# Patient Record
Sex: Female | Born: 1985 | Race: Black or African American | Hispanic: No | Marital: Single | Smoking: Former smoker
Health system: Southern US, Community
[De-identification: ages and names within clinical notes are randomized; demographics above are authoritative.]

## PROBLEM LIST (undated history)

## (undated) DIAGNOSIS — E119 Type 2 diabetes mellitus without complications: Secondary | ICD-10-CM

## (undated) DIAGNOSIS — T8859XA Other complications of anesthesia, initial encounter: Secondary | ICD-10-CM

## (undated) DIAGNOSIS — D649 Anemia, unspecified: Secondary | ICD-10-CM

## (undated) DIAGNOSIS — F4310 Post-traumatic stress disorder, unspecified: Secondary | ICD-10-CM

## (undated) DIAGNOSIS — K219 Gastro-esophageal reflux disease without esophagitis: Secondary | ICD-10-CM

## (undated) DIAGNOSIS — E282 Polycystic ovarian syndrome: Secondary | ICD-10-CM

## (undated) DIAGNOSIS — G4730 Sleep apnea, unspecified: Secondary | ICD-10-CM

## (undated) DIAGNOSIS — F32A Depression, unspecified: Secondary | ICD-10-CM

## (undated) DIAGNOSIS — G43909 Migraine, unspecified, not intractable, without status migrainosus: Secondary | ICD-10-CM

## (undated) DIAGNOSIS — F419 Anxiety disorder, unspecified: Secondary | ICD-10-CM

## (undated) DIAGNOSIS — C539 Malignant neoplasm of cervix uteri, unspecified: Secondary | ICD-10-CM

## (undated) HISTORY — PX: DILATION AND CURETTAGE, DIAGNOSTIC / THERAPEUTIC: SUR384

## (undated) HISTORY — PX: WISDOM TOOTH EXTRACTION: SHX21

## (undated) HISTORY — PX: CERVICAL POLYPECTOMY: SHX88

---

## 2006-08-07 ENCOUNTER — Emergency Department: Payer: Self-pay | Admitting: Emergency Medicine

## 2009-11-15 ENCOUNTER — Emergency Department: Payer: Self-pay | Admitting: Unknown Physician Specialty

## 2011-02-01 ENCOUNTER — Emergency Department: Payer: Self-pay

## 2011-12-22 ENCOUNTER — Emergency Department: Payer: Self-pay | Admitting: Emergency Medicine

## 2011-12-22 LAB — URINALYSIS, COMPLETE
Bilirubin,UR: NEGATIVE
Ketone: NEGATIVE
Nitrite: NEGATIVE
Ph: 6 (ref 4.5–8.0)
Protein: NEGATIVE
Specific Gravity: 1.005 (ref 1.003–1.030)
WBC UR: 7 /HPF (ref 0–5)

## 2011-12-22 LAB — HCG, QUANTITATIVE, PREGNANCY: Beta Hcg, Quant.: 149626 m[IU]/mL — ABNORMAL HIGH

## 2011-12-22 LAB — PREGNANCY, URINE: Pregnancy Test, Urine: POSITIVE m[IU]/mL

## 2012-01-20 ENCOUNTER — Emergency Department: Payer: Self-pay | Admitting: Emergency Medicine

## 2012-01-20 LAB — URINALYSIS, COMPLETE
Bilirubin,UR: NEGATIVE
Glucose,UR: NEGATIVE mg/dL (ref 0–75)
Ketone: NEGATIVE
Leukocyte Esterase: NEGATIVE
Nitrite: NEGATIVE
Ph: 6 (ref 4.5–8.0)
Protein: NEGATIVE
RBC,UR: 13 /HPF (ref 0–5)
Squamous Epithelial: 1
WBC UR: 5 /HPF (ref 0–5)

## 2012-01-20 LAB — HCG, QUANTITATIVE, PREGNANCY: Beta Hcg, Quant.: 89989 m[IU]/mL — ABNORMAL HIGH

## 2012-01-20 LAB — CBC
HCT: 34.4 % — ABNORMAL LOW (ref 35.0–47.0)
HGB: 11 g/dL — ABNORMAL LOW (ref 12.0–16.0)
MCH: 28.9 pg (ref 26.0–34.0)
MCV: 90 fL (ref 80–100)
Platelet: 178 10*3/uL (ref 150–440)
RBC: 3.82 10*6/uL (ref 3.80–5.20)
RDW: 13.5 % (ref 11.5–14.5)

## 2012-01-27 ENCOUNTER — Ambulatory Visit: Payer: Self-pay | Admitting: Obstetrics & Gynecology

## 2012-02-11 ENCOUNTER — Ambulatory Visit: Payer: Self-pay | Admitting: Obstetrics & Gynecology

## 2012-03-13 ENCOUNTER — Ambulatory Visit: Payer: Self-pay | Admitting: Obstetrics & Gynecology

## 2012-06-17 ENCOUNTER — Observation Stay: Payer: Self-pay | Admitting: Obstetrics and Gynecology

## 2012-07-15 ENCOUNTER — Observation Stay: Payer: Self-pay | Admitting: Obstetrics and Gynecology

## 2012-07-15 LAB — PIH PROFILE
Anion Gap: 10 (ref 7–16)
BUN: 9 mg/dL (ref 7–18)
Calcium, Total: 9.6 mg/dL (ref 8.5–10.1)
Creatinine: 0.91 mg/dL (ref 0.60–1.30)
EGFR (African American): >60
Glucose: 122 mg/dL — ABNORMAL HIGH (ref 65–99)
HCT: 33 % — ABNORMAL LOW (ref 35.0–47.0)
HGB: 11.2 g/dL — ABNORMAL LOW (ref 12.0–16.0)
MCH: 30 pg (ref 26.0–34.0)
MCHC: 34 g/dL (ref 32.0–36.0)
MCV: 88 fL (ref 80–100)
Osmolality: 276 (ref 275–301)
RBC: 3.74 10*6/uL — ABNORMAL LOW (ref 3.80–5.20)
WBC: 8.9 10*3/uL (ref 3.6–11.0)

## 2012-07-15 LAB — PROTEIN / CREATININE RATIO, URINE: Protein, Random Urine: 50 mg/dL — ABNORMAL HIGH (ref 0–12)

## 2012-07-19 ENCOUNTER — Inpatient Hospital Stay: Payer: Self-pay | Admitting: Obstetrics & Gynecology

## 2012-07-19 LAB — CBC WITH DIFFERENTIAL/PLATELET
Basophil #: 0 10*3/uL (ref 0.0–0.1)
Eosinophil #: 0.1 10*3/uL (ref 0.0–0.7)
Eosinophil #: 0.1 10*3/uL (ref 0.0–0.7)
Eosinophil %: 1.3 %
Eosinophil %: 0.5 %
HCT: 32.3 % — ABNORMAL LOW (ref 35.0–47.0)
HGB: 11.1 g/dL — ABNORMAL LOW (ref 12.0–16.0)
HGB: 11.6 g/dL — ABNORMAL LOW (ref 12.0–16.0)
Lymphocyte #: 1.6 10*3/uL (ref 1.0–3.6)
Lymphocyte %: 17.2 %
Lymphocyte %: 11.7 %
MCHC: 34.4 g/dL (ref 32.0–36.0)
MCV: 88 fL (ref 80–100)
MCV: 89 fL (ref 80–100)
Monocyte #: 0.8 x10 3/mm (ref 0.2–0.9)
Monocyte %: 7.8 %
Neutrophil #: 9 10*3/uL — ABNORMAL HIGH (ref 1.4–6.5)
Neutrophil %: 73.1 %
Neutrophil %: 80 %
Platelet: 103 10*3/uL — ABNORMAL LOW (ref 150–440)
Platelet: 105 10*3/uL — ABNORMAL LOW (ref 150–440)
RDW: 16.4 % — ABNORMAL HIGH (ref 11.5–14.5)
WBC: 9.3 10*3/uL (ref 3.6–11.0)

## 2012-07-19 LAB — RUPTURE OF MEMBRANE PLUS: Rom Plus: DETECTED

## 2012-10-13 ENCOUNTER — Ambulatory Visit: Payer: Self-pay | Admitting: Obstetrics & Gynecology

## 2012-10-13 LAB — CBC
HCT: 39.8 % (ref 35.0–47.0)
MCHC: 33.6 g/dL (ref 32.0–36.0)
MCV: 91 fL (ref 80–100)
Platelet: 177 10*3/uL (ref 150–440)
RBC: 4.39 10*6/uL (ref 3.80–5.20)
RDW: 15.4 % — ABNORMAL HIGH (ref 11.5–14.5)

## 2012-10-13 LAB — PREGNANCY, URINE: Pregnancy Test, Urine: NEGATIVE m[IU]/mL

## 2012-10-14 ENCOUNTER — Ambulatory Visit: Payer: Self-pay | Admitting: Obstetrics & Gynecology

## 2013-02-14 ENCOUNTER — Emergency Department: Payer: Self-pay | Admitting: Emergency Medicine

## 2013-02-17 LAB — BETA STREP CULTURE(ARMC)

## 2013-05-07 ENCOUNTER — Emergency Department: Payer: Self-pay | Admitting: Emergency Medicine

## 2013-05-07 LAB — COMPREHENSIVE METABOLIC PANEL
Albumin: 3.5 g/dL (ref 3.4–5.0)
Alkaline Phosphatase: 68 U/L
Anion Gap: 5 — ABNORMAL LOW (ref 7–16)
BUN: 8 mg/dL (ref 7–18)
Bilirubin,Total: 0.4 mg/dL (ref 0.2–1.0)
Calcium, Total: 8.5 mg/dL (ref 8.5–10.1)
Chloride: 105 mmol/L (ref 98–107)
Co2: 26 mmol/L (ref 21–32)
Creatinine: 0.85 mg/dL (ref 0.60–1.30)
EGFR (African American): >60
EGFR (Non-African Amer.): >60
Glucose: 82 mg/dL (ref 65–99)
Osmolality: 269 (ref 275–301)
Potassium: 3.6 mmol/L (ref 3.5–5.1)
SGOT(AST): 16 U/L (ref 15–37)
SGPT (ALT): 12 U/L (ref 12–78)
Sodium: 136 mmol/L (ref 136–145)
Total Protein: 8.4 g/dL — ABNORMAL HIGH (ref 6.4–8.2)

## 2013-05-07 LAB — CBC
HCT: 39.3 % (ref 35.0–47.0)
HGB: 12.8 g/dL (ref 12.0–16.0)
MCH: 29.3 pg (ref 26.0–34.0)
MCHC: 32.7 g/dL (ref 32.0–36.0)
MCV: 90 fL (ref 80–100)
PLATELETS: 198 10*3/uL (ref 150–440)
RBC: 4.37 10*6/uL (ref 3.80–5.20)
RDW: 13.9 % (ref 11.5–14.5)
WBC: 9.2 10*3/uL (ref 3.6–11.0)

## 2013-05-07 LAB — URINALYSIS, COMPLETE
BACTERIA: NONE SEEN
BLOOD: NEGATIVE
Bilirubin,UR: NEGATIVE
Glucose,UR: NEGATIVE mg/dL (ref 0–75)
Hyaline Cast: 2
Ketone: NEGATIVE
Leukocyte Esterase: NEGATIVE
Nitrite: NEGATIVE
Ph: 5 (ref 4.5–8.0)
Specific Gravity: 1.024 (ref 1.003–1.030)
Squamous Epithelial: 2
WBC UR: 7 /HPF (ref 0–5)

## 2013-05-07 LAB — MONONUCLEOSIS SCREEN: MONO TEST: POSITIVE

## 2013-05-07 LAB — RAPID INFLUENZA A&B ANTIGENS

## 2013-05-10 LAB — BETA STREP CULTURE(ARMC)

## 2013-09-03 IMAGING — US US OB < 14 WEEKS
1 series · 14 of 28 positions shown · non-contrast
Comparison: none

REASON FOR EXAM: 13 weeks with painles vaginal bleeding
COMMENTS:

[Series 1: us ob < 14 weeks · 0.23mm/px · 44 acquisitions, 14 frames shown]
[im 2/44]
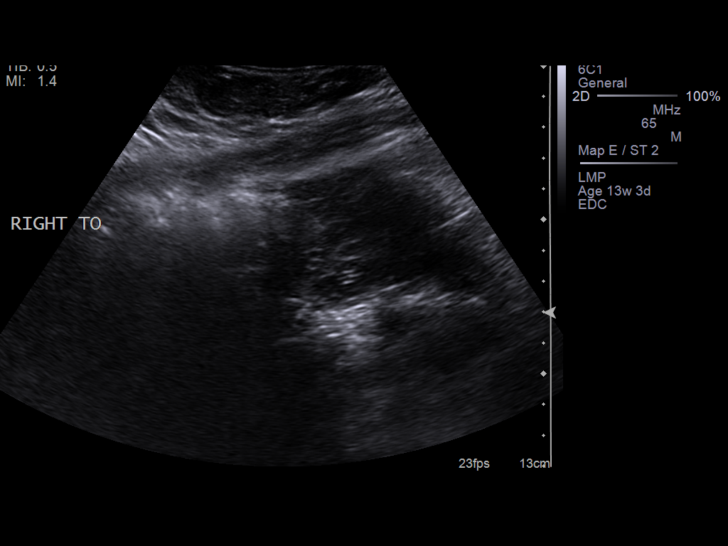
[im 5/44]
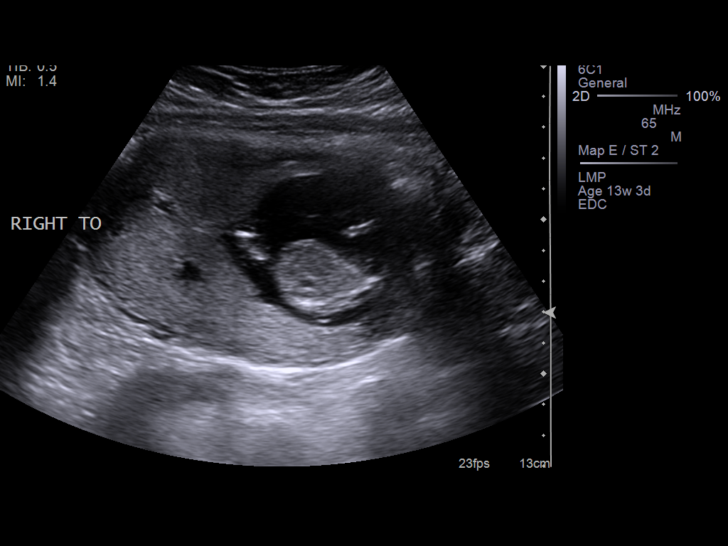
[im 8/44]
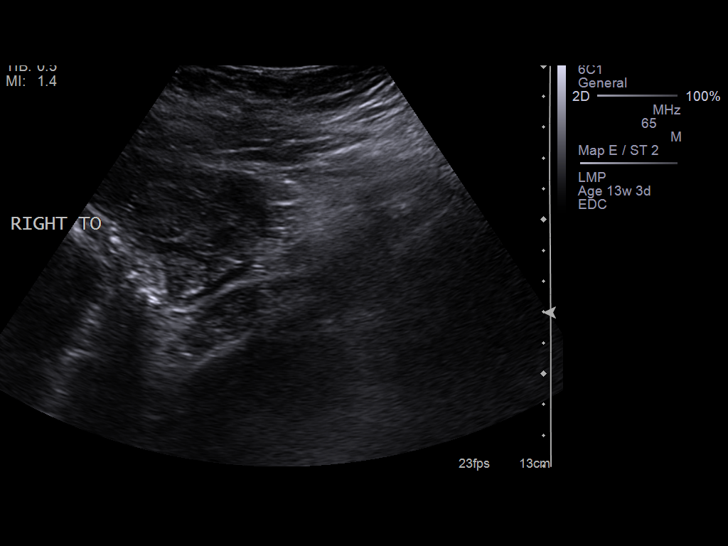
[im 12/44]
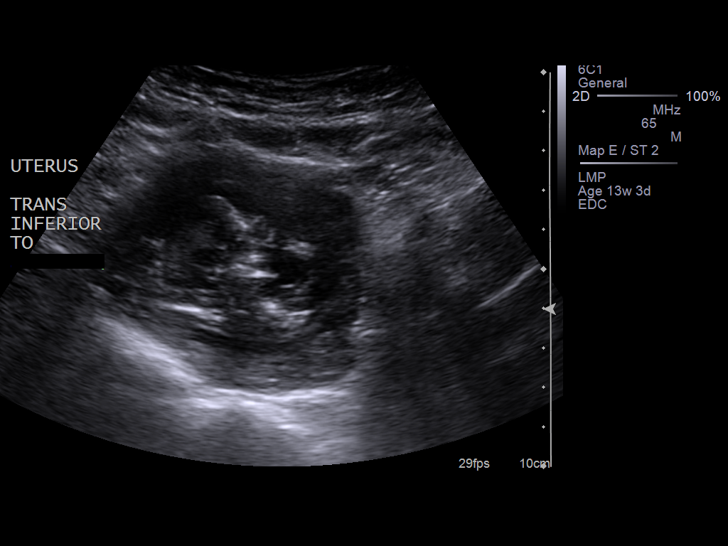
[im 15/44]
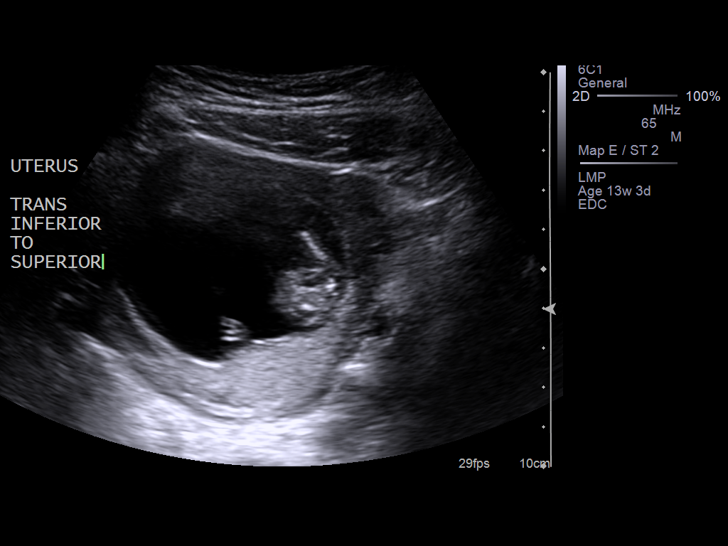
[im 18/44]
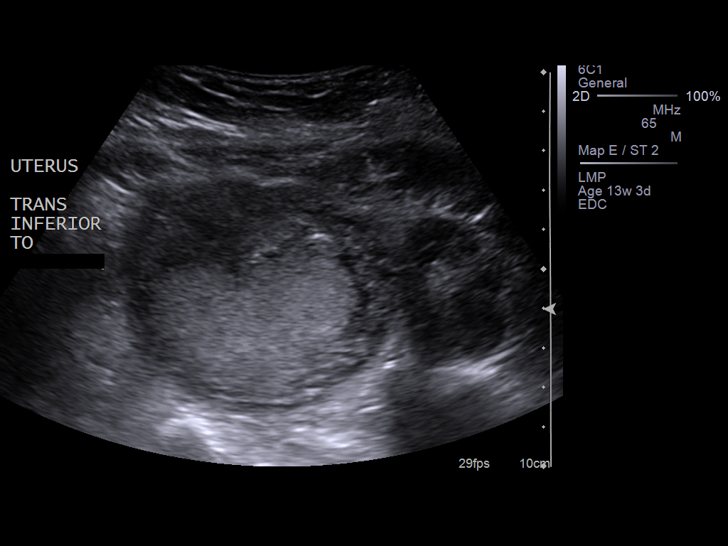
[im 21/44]
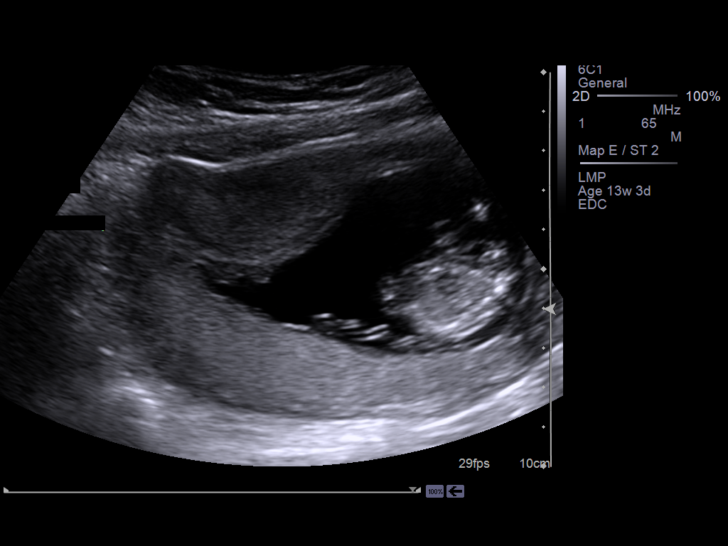
[im 24/44]
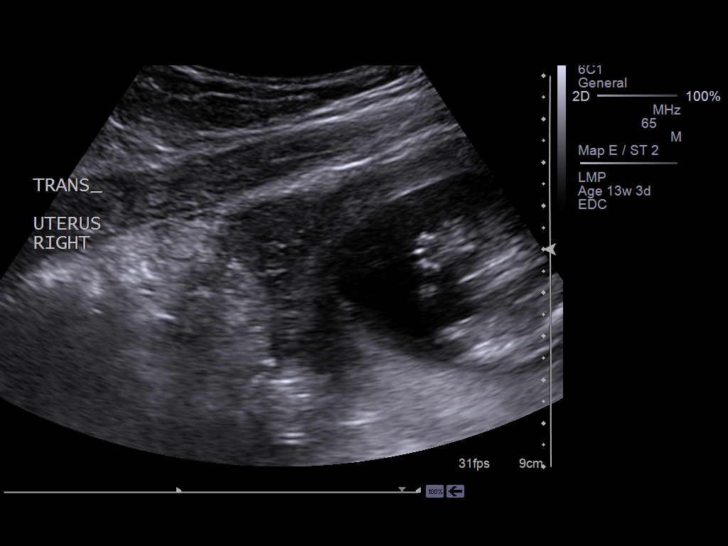
[im 28/44]
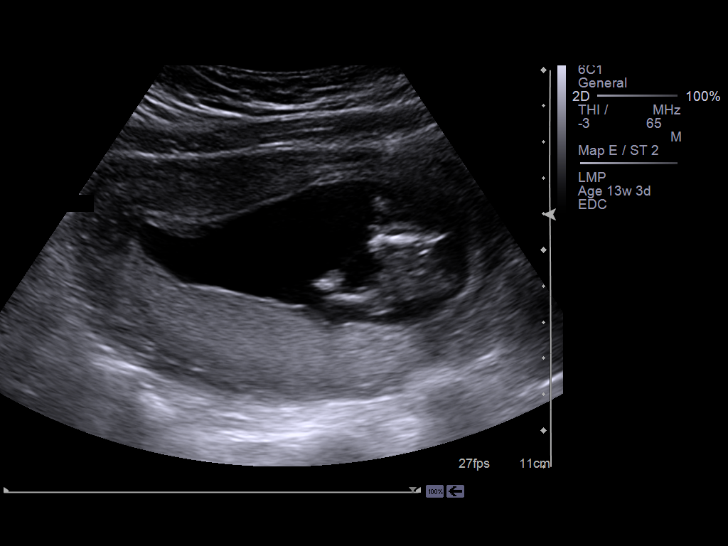
[im 31/44]
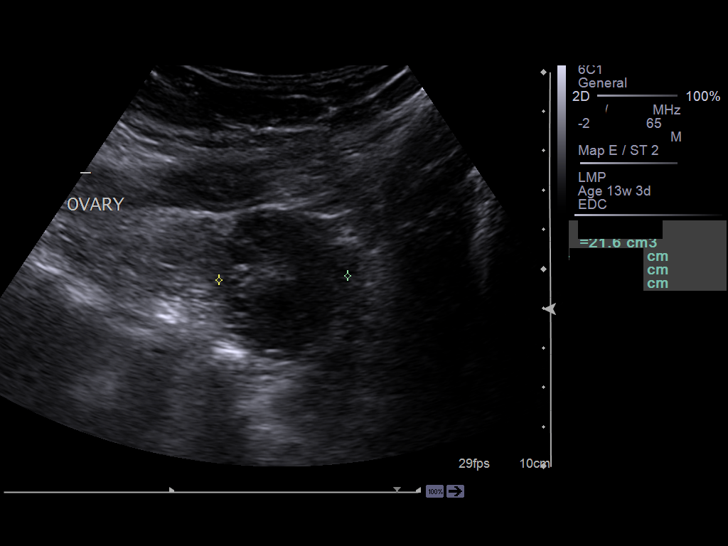
[im 34/44]
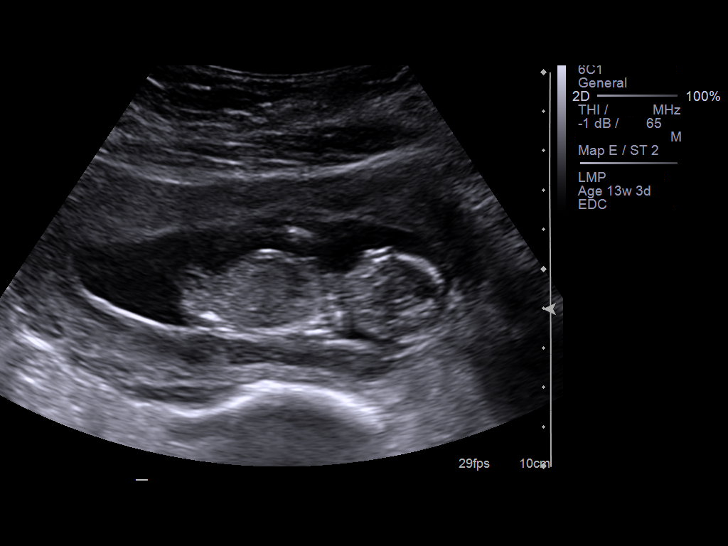
[im 37/44]
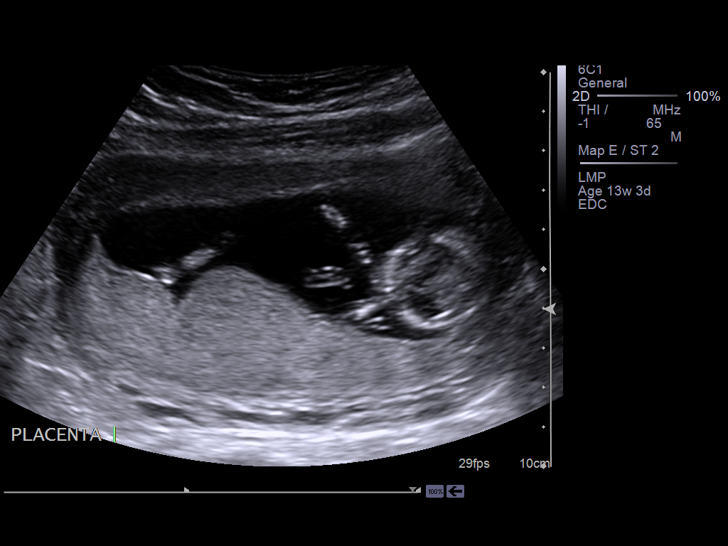
[im 40/44]
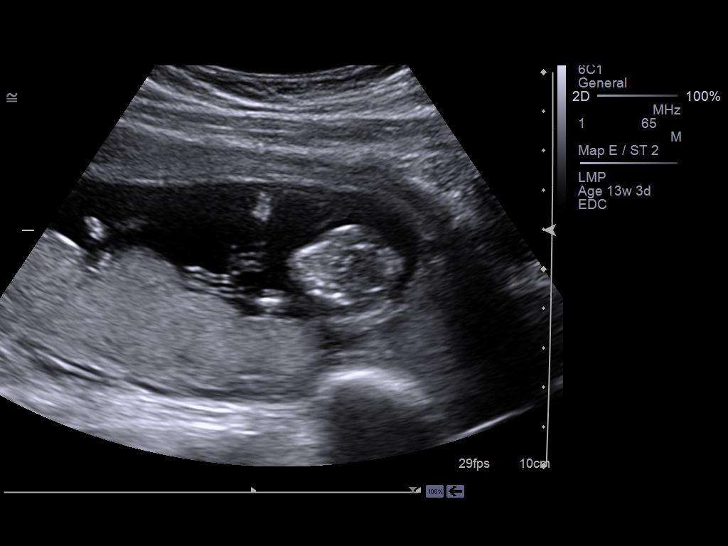
[im 44/44]
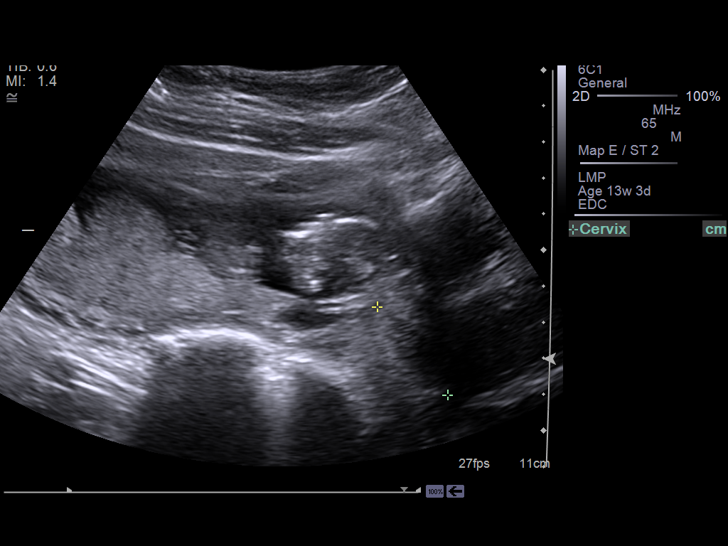

[14 of 28 positions shown; findings below may reference images not displayed]

PROCEDURE:     US  - US OB LESS THAN 14 WEEKS  - January 20, 2012  [DATE]

RESULT:     Single viable intrauterine pregnancy is present with a fetal
heart rate of 157 beats per minute. Fetal age of 13 weeks 2 days by
measurements. Ovaries are normal. Age-appropriate growth  from prior study
of 12/22/2011. 2 cm fibroid is noted in the right ureter wall. Amniotic
fluid volume is normal. Cervix normal.
IMPRESSION: Single viable intrauterine pregnancy. Small uterine fibroid.

## 2013-09-30 ENCOUNTER — Emergency Department: Payer: Self-pay | Admitting: Emergency Medicine

## 2013-09-30 LAB — URINALYSIS, COMPLETE
Bilirubin,UR: NEGATIVE
Blood: NEGATIVE
Ketone: NEGATIVE
Leukocyte Esterase: NEGATIVE
Nitrite: POSITIVE
Ph: 6 (ref 4.5–8.0)
Protein: NEGATIVE
SPECIFIC GRAVITY: 1.021 (ref 1.003–1.030)
Squamous Epithelial: 2
WBC UR: 3 /HPF (ref 0–5)

## 2014-06-02 NOTE — Op Note (Signed)
PATIENT NAME:  Michelle Holland, Michelle Holland MR#:  [id number] DATE OF BIRTH:  [birth year]  DATE OF PROCEDURE:  07/20/2012  PREOPERATIVE DIAGNOSIS:   Large for gestational age baby, small pelvis, arrest of dilation at 4 cm.  Gestational diabetes on medication, poorly controlled.   POSTOPERATIVE DIAGNOSIS:  Large for gestational age baby, small pelvis, arrest of dilation at 4 cm.  Gestational diabetes on medication, poorly controlled.   PROCEDURE:  Primary LUT C-section.   ESTIMATED BLOOD LOSS:  1000 mL.   FINDINGS:  A 9 pound, 5 ounce baby with normal uterus, tubes and ovaries bilaterally,  large theca lutein cysts on the ovaries bilaterally.    SURGEON:  Delsa Sale, M.D.   ASSISTANTDonzetta Matters.   DESCRIPTION OF PROCEDURE:  The patient was taken to the operating room and placed in supine position. After adequate anesthesia was instilled, the patient was prepped and draped in the usual sterile fashion. Pfannenstiel skin incision was drawn and cut 2 fingerbreadths above the pubic symphysis and carried sharply down to the fascia. The fascia was nicked in the midline, and the incision was extended in a superolateral manner. The muscle bellies were sharply and bluntly dissected off the rectus fascia. Midline of the bellies was identified and split. The peritoneum was grasped, cut and entered. Bladder blade was placed. A bladder flap was created. Uterine incision was made, and infant was delivered and bulb suctioned. Remainder of the infant's body was delivered with some difficulty due to the size of the baby. The uterus was exteriorized and wrapped in a moist laparotomy sponge. The interior of the uterus was curetted with a moist lap sponge. Penningtons were placed on the uterine incision edge and chromic in a locked fashion was used to close the incision. A second imbricating incision was made over that.  The bladder flap was then tacked back up to the uterine incision using 3-0 chromic. The belly of  the abdomen was cleared of clots. The uterus was placed back into the abdomen. Kelly clamps were used to grasp the peritoneum. The muscle bellies were approximated and sewn in  a running fashion with Vicryl suture. The muscle bellies were found to be seen as static. The ON-Q trocars were placed. The fascia was closed in a running fashion with PDS suture from both corners meeting in the midline. The midline XLH was then used to approximate the subcutaneous fat. Skin clips were placed. A bandage was placed. Dermabond was placed where the catheters from the ON-Q came from the belly. The catheters were wrapped around a 4 x 4. Steri-Strips were placed. Tegaderm was placed. The uterus was found to be firm and clots were expressed. Clear urine was noted in the Foley bag, and the patient was taken to recovery after having tolerated the procedure well.     ____________________________ Delsa Sale, MD cck:dmm D: 07/22/2012 08:17:00 ET T: 07/22/2012 10:26:55 ET JOB#: [id number]  cc: Delsa Sale, MD, <Dictator> Delsa Sale MD ELECTRONICALLY SIGNED 07/26/2012 22:04

## 2014-06-02 NOTE — Op Note (Signed)
PATIENT NAME:  Michelle Holland, Michelle Holland MR#:  [id number] DATE OF BIRTH:  [birth year]  DATE OF PROCEDURE:  10/14/2012  PREOPERATIVE DIAGNOSIS: Abnormal uterine bleeding with endometrial abnormality.  POSTOPERATIVE DIAGNOSIS: Abnormal uterine bleeding with endometrial abnormality.  PROCEDURES: Hysteroscopy dilation and curettage.  SURGEON: Glean Salen, MD  ANESTHESIA: General.   ESTIMATED BLOOD LOSS: Minimal.   COMPLICATIONS: None.   FINDINGS: Proliferative endometrium with no polyps or fibroids seen.  DISPOSITION: To recovery room in stable condition.   TECHNIQUE: The patient is prepped and draped in the usual sterile fashion after adequate anesthesia is obtained in the dorsal lithotomy position. The bladder is drained with a catheter followed by placement of the speculum and the cervix is grasped with a tenaculum. The cervix is dilated to a size 20 Pratt dilator and then a 30 degree hysteroscope with lactated Ringer distention of intrauterine cavity is performed. The above-mentioned findings are visualized. The ostia are seen bilaterally and no gross abnormalities. Hysteroscope is removed with minimal discrepancy in fluid and curettage is performed using a banjo curette with specimen sent to pathology for further review. The tenaculum is removed and a suture is placed over the tenaculum sites to obtain hemostasis. There is minimal bleeding coming from the cervix. The patient is cleansed of all blood and Betadine and goes to the recovery room in stable condition. All sponge, instrument and needle counts are correct.  ____________________________ R. Barnett Applebaum, MD rph:sb D: 10/14/2012 12:52:57 ET T: 10/14/2012 13:14:03 ET JOB#: [id number]  cc: Glean Salen, MD, <Dictator> Gae Dry MD ELECTRONICALLY SIGNED 10/14/2012 18:15

## 2014-06-20 NOTE — H&P (Signed)
L&D Evaluation:  History:  HPI 29 yo G1 at 38w5dgestational age by LMP consistent with 1st trimester ultrasound.  Pregnancy complicated by obesity and gestational diabetes that has been diet controlled.  She was seen in clinic today and noted to have new-onset edema, an 8-pound weight gain over the past 2 weeks, some intermittent headaches, and proteinuria (trace).  She was sent to L&D for further assessment.  Here she notes positive fetal movement, no vaginal bleeding, no leakage of fluid (though she thinks she lost her mucous plug).  She was checked and 2cm dilated in clinic today by Dr. SGeorgianne Fick  She has occasional contractions. She denies current headache, visual disturances, and RUQ pain.  A+ / VZI / HBsAg neg / RI / RPR NR // hgb electrophoresis nml   Patient's Medical History No Chronic Illness   Patient's Surgical History none   Medications Pre Natal Vitamins   Allergies NKDA   Social History none   Family History Non-Contributory   ROS:  ROS All systems were reviewed.  HEENT, CNS, GI, GU, Respiratory, CV, Renal and Musculoskeletal systems were found to be normal., unless noted in HPI   Exam:  Vital Signs stable  110-117/55-62   General no apparent distress   Mental Status clear   Chest clear   Heart normal sinus rhythm   Abdomen gravid, non-tender   Back no CVAT   Edema Pitting  trace bilateral   Mebranes Intact   FHT normal rate with no decels, 150/mod var/+accels/no decels   Ucx irregular, irregular   Skin no lesions   Impression:  Impression reactive NST, 1) no evidence of preeclampsia due to lack of hypertension   Plan:  Plan EFM/NST   Comments patient has proteinuria that is significant, but BPs easily within normal limits today.  Will have her follow up more closely as she is high risk for developing overt preeclampsia.  Preeclampsia precautions given, as well as usual labor and fetal kick count precautions reiterated.   Labs:  Lab  Results: Hepatic:  05-Jun-14 17:25   SGOT (AST)  12 (Result(s) reported on 15 Jul 2012 at 06:00PM.)  Routine Chem:  05-Jun-14 17:25   Uric Acid, Serum  7.2 (Result(s) reported on 15 Jul 2012 at 06:00PM.)  Glucose, Serum  122  BUN 9  Creatinine (comp) 0.91  Sodium, Serum 138  Potassium, Serum 3.6  Chloride, Serum 105  CO2, Serum 23  Osmolality (calc) 276  Anion Gap 10  Calcium (Total), Serum 9.6  eGFR (Non-African American) >60 (eGFR values <648mmin/1.73 m2 may be an indication of chronic kidney disease (CKD). Calculated eGFR is useful in patients with stable renal function. The eGFR calculation will not be reliable in acutely ill patients when serum creatinine is changing rapidly. It is not useful in  patients on dialysis. The eGFR calculation may not be applicable to patients at the low and high extremes of body sizes, pregnant women, and vegetarians.)  eGFR (African American) >60  Misc Urine Chem:  05-Jun-14 17:11   Creatinine, Urine  128.6  Protein, Random Urine  50  Protein/Creat Ratio (comp)  389 (Result(s) reported on 15 Jul 2012 at 05:49PM.)  Routine Hem:  05-Jun-14 17:25   WBC (CBC) 8.9  RBC (CBC)  3.74  Hemoglobin (CBC)  11.2  Hematocrit (CBC)  33.0  Platelet Count (CBC)  103 (Result(s) reported on 15 Jul 2012 at 06:00PM.)  MCV 88  MCH 30.0  MCHC 34.0  RDW  16.1   Electronic Signatures: JaGlennon Mac  Estill Bamberg (MD)  (Signed 05-Jun-14 20:22)  Authored: L&D Evaluation, Labs   Last Updated: 05-Jun-14 20:22 by Will Bonnet (MD)

## 2014-06-20 NOTE — H&P (Signed)
L&D Evaluation:  History:  HPI 29 yo G1 with EDC=07/24/2012 by LMP=10/18/2011 and consistent with 1st trimester ultrasound presents at 79 2/7 weeks with c/o LOF since 0700 this AM. The fluid looked clear with some white particles. Denies VB. Having some ctxs-inconsistent in strength. Pregnancy complicated by obesity and gestational diabetes that has been diet controlled. Has had some recent onset edema, headaches,  and an  8-pound weight gain over the past 2 1/2weeks  She had a PIH evaluation on 5June at which time her BPs were normal, but she had some mild thrombocytopenia and PRO/CR=389. She denies current headache, visual disturances, and RUQ pain. Labs: A+ / VZI / HBsAg neg / RI / RPR NR // hgb electrophoresis nml, GBS negative   Presents with leaking fluid   Patient's Medical History Obesity, GDM   Patient's Surgical History none   Medications Pre Natal Vitamins   Allergies Yeast Gaurd   Social History none   Family History Non-Contributory   ROS:  ROS see HPI   Exam:  Vital Signs stable  117/73    Urine Protein not completed   General no apparent distress   Mental Status clear    Chest clear    Heart normal sinus rhythm, no murmur/gallop/rubs   Abdomen gravid, tender with contractions   Estimated Fetal Weight 7 1/2 to 8#   Fetal Position cephalic on ultrasound   Edema 2+    Pelvic no external lesions, SSE: pooling of clear watery fluid with white mucoepithelial discharge.  Fern negative. Nitrazine negative.  Wet prep: +hyphae.  Cx:1.5/50%/-3 per RN exam on arrival.   Mebranes Questionable   FHT normal rate with no decels, 140s with accels to 160s to 170s   FHT Description mod variability   Ucx q2-5 min, inconsistent in strength   Skin dry   Other FSBS=90   Impression:  Impression IUP at 39 2/7 weeks with questionable SROM and possible early labor. GDM controlled on diet   Plan:  Plan EFM/NST, monitor contractions and for cervical change, ROM PLus  sent to lab. Can eat breakfast   Electronic Signatures: Michelle Holland (CNM)  (Signed 09-Jun-14 21:24)  Authored: L&D Evaluation   Last Updated: 09-Jun-14 21:24 by Michelle Holland (CNM)

## 2014-11-25 ENCOUNTER — Encounter: Payer: Self-pay | Admitting: *Deleted

## 2014-11-25 ENCOUNTER — Emergency Department
Admission: EM | Admit: 2014-11-25 | Discharge: 2014-11-25 | Disposition: A | Discharge status: Home / Self Care | Payer: Self-pay | Attending: Emergency Medicine | Admitting: Emergency Medicine

## 2014-11-25 DIAGNOSIS — G43709 Chronic migraine without aura, not intractable, without status migrainosus: Secondary | ICD-10-CM | POA: Insufficient documentation

## 2014-11-25 DIAGNOSIS — Z91040 Latex allergy status: Secondary | ICD-10-CM | POA: Insufficient documentation

## 2014-11-25 DIAGNOSIS — Z720 Tobacco use: Secondary | ICD-10-CM | POA: Insufficient documentation

## 2014-11-25 HISTORY — DX: Migraine, unspecified, not intractable, without status migrainosus: G43.909

## 2014-11-25 HISTORY — DX: Malignant neoplasm of cervix uteri, unspecified: C53.9

## 2014-11-25 HISTORY — DX: Gastro-esophageal reflux disease without esophagitis: K21.9

## 2014-11-25 LAB — CBC
HCT: 33.5 % — ABNORMAL LOW (ref 35.0–47.0)
Hemoglobin: 11.3 g/dL — ABNORMAL LOW (ref 12.0–16.0)
MCH: 30.1 pg (ref 26.0–34.0)
MCHC: 33.7 g/dL (ref 32.0–36.0)
MCV: 89.4 fL (ref 80.0–100.0)
PLATELETS: 157 10*3/uL (ref 150–440)
RBC: 3.75 MIL/uL — AB (ref 3.80–5.20)
RDW: 13.8 % (ref 11.5–14.5)
WBC: 8 10*3/uL (ref 3.6–11.0)

## 2014-11-25 LAB — BASIC METABOLIC PANEL
ANION GAP: 7 (ref 5–15)
BUN: 9 mg/dL (ref 6–20)
CALCIUM: 8.4 mg/dL — AB (ref 8.9–10.3)
CO2: 26 mmol/L (ref 22–32)
Chloride: 103 mmol/L (ref 101–111)
Creatinine, Ser: 0.65 mg/dL (ref 0.44–1.00)
GFR calc non Af Amer: >60 mL/min (ref >60)
Glucose, Bld: 103 mg/dL — ABNORMAL HIGH (ref 65–99)
POTASSIUM: 3.6 mmol/L (ref 3.5–5.1)
SODIUM: 136 mmol/L (ref 135–145)

## 2014-11-25 LAB — TROPONIN I

## 2014-11-25 MED ORDER — DIPHENHYDRAMINE HCL 50 MG/ML IJ SOLN
50.0000 mg | Freq: Once | INTRAMUSCULAR | Status: AC
  Administered 2014-11-25: 50 mg via INTRAMUSCULAR
  Filled 2014-11-25: qty 1

## 2014-11-25 MED ORDER — KETOROLAC TROMETHAMINE 30 MG/ML IJ SOLN
60.0000 mg | Freq: Once | INTRAMUSCULAR | Status: AC
  Administered 2014-11-25: 60 mg via INTRAMUSCULAR
  Filled 2014-11-25: qty 2

## 2014-11-25 MED ORDER — PROMETHAZINE HCL 25 MG/ML IJ SOLN
25.0000 mg | Freq: Once | INTRAMUSCULAR | Status: AC
  Administered 2014-11-25: 25 mg via INTRAMUSCULAR
  Filled 2014-11-25: qty 1

## 2014-11-25 NOTE — ED Notes (Signed)
Patient states that she has had headache for last 3 weeks. Although states she has some blurred vision is able to use phone/tablet without complications. States today back teeth are hurting. Patient ambulated without complication to room from lobby.

## 2014-11-25 NOTE — Discharge Instructions (Signed)
Migraine Headache A migraine headache is an intense, throbbing pain on one or both sides of your head. A migraine can last for 30 minutes to several hours. CAUSES  The exact cause of a migraine headache is not always known. However, a migraine may be caused when nerves in the brain become irritated and release chemicals that cause inflammation. This causes pain. Certain things may also trigger migraines, such as:  Alcohol.  Smoking.  Stress.  Menstruation.  Aged cheeses.  Foods or drinks that contain nitrates, glutamate, aspartame, or tyramine.  Lack of sleep.  Chocolate.  Caffeine.  Hunger.  Physical exertion.  Fatigue.  Medicines used to treat chest pain (nitroglycerine), birth control pills, estrogen, and some blood pressure medicines. SIGNS AND SYMPTOMS  Pain on one or both sides of your head.  Pulsating or throbbing pain.  Severe pain that prevents daily activities.  Pain that is aggravated by any physical activity.  Nausea, vomiting, or both.  Dizziness.  Pain with exposure to bright lights, loud noises, or activity.  General sensitivity to bright lights, loud noises, or smells. Before you get a migraine, you may get warning signs that a migraine is coming (aura). An aura may include:  Seeing flashing lights.  Seeing bright spots, halos, or zigzag lines.  Having tunnel vision or blurred vision.  Having feelings of numbness or tingling.  Having trouble talking.  Having muscle weakness. DIAGNOSIS  A migraine headache is often diagnosed based on:  Symptoms.  Physical exam.  A CT scan or MRI of your head. These imaging tests cannot diagnose migraines, but they can help rule out other causes of headaches. TREATMENT Medicines may be given for pain and nausea. Medicines can also be given to help prevent recurrent migraines.  HOME CARE INSTRUCTIONS  Only take over-the-counter or prescription medicines for pain or discomfort as directed by your  health care provider. The use of long-term narcotics is not recommended.  Lie down in a dark, quiet room when you have a migraine.  Keep a journal to find out what may trigger your migraine headaches. For example, write down:  What you eat and drink.  How much sleep you get.  Any change to your diet or medicines.  Limit alcohol consumption.  Quit smoking if you smoke.  Get 7-9 hours of sleep, or as recommended by your health care provider.  Limit stress.  Keep lights dim if bright lights bother you and make your migraines worse. SEEK IMMEDIATE MEDICAL CARE IF:   Your migraine becomes severe.  You have a fever.  You have a stiff neck.  You have vision loss.  You have muscular weakness or loss of muscle control.  You start losing your balance or have trouble walking.  You feel faint or pass out.  You have severe symptoms that are different from your first symptoms. MAKE SURE YOU:   Understand these instructions.  Will watch your condition.  Will get help right away if you are not doing well or get worse.   This information is not intended to replace advice given to you by your health care provider. Make sure you discuss any questions you have with your health care provider.   Document Released: 01/27/2005 Document Revised: 02/17/2014 Document Reviewed: 10/04/2012 Elsevier Interactive Patient Education 2016 [employer]  Recurrent Migraine Headache A migraine headache is an intense, throbbing pain on one or both sides of your head. Recurrent migraines keep coming back. A migraine can last for 30 minutes to several hours.  CAUSES  The exact cause of a migraine headache is not always known. However, a migraine may be caused when nerves in the brain become irritated and release chemicals that cause inflammation. This causes pain. Certain things may also trigger migraines, such as:   Alcohol.  Smoking.  Stress.  Menstruation.  Aged cheeses.  Foods or  drinks that contain nitrates, glutamate, aspartame, or tyramine.  Lack of sleep.  Chocolate.  Caffeine.  Hunger.  Physical exertion.  Fatigue.  Medicines used to treat chest pain (nitroglycerine), birth control pills, estrogen, and some blood pressure medicines. SYMPTOMS   Pain on one or both sides of your head.  Pulsating or throbbing pain.  Severe pain that prevents daily activities.  Pain that is aggravated by any physical activity.  Nausea, vomiting, or both.  Dizziness.  Pain with exposure to bright lights, loud noises, or activity.  General sensitivity to bright lights, loud noises, or smells. Before you get a migraine, you may get warning signs that a migraine is coming (aura). An aura may include:  Seeing flashing lights.  Seeing bright spots, halos, or zigzag lines.  Having tunnel vision or blurred vision.  Having feelings of numbness or tingling.  Having trouble talking.  Having muscle weakness. DIAGNOSIS  A recurrent migraine headache is often diagnosed based on:  Symptoms.  Physical examination.  A CT scan or MRI of your head. These imaging tests cannot diagnose migraines but can help rule out other causes of headaches.  TREATMENT  Medicines may be given for pain and nausea. Medicines can also be given to help prevent recurrent migraines. HOME CARE INSTRUCTIONS  Only take over-the-counter or prescription medicines for pain or discomfort as directed by your health care provider. The use of long-term narcotics is not recommended.  Lie down in a dark, quiet room when you have a migraine.  Keep a journal to find out what may trigger your migraine headaches. For example, write down:  What you eat and drink.  How much sleep you get.  Any change to your diet or medicines.  Limit alcohol consumption.  Quit smoking if you smoke.  Get 7-9 hours of sleep, or as recommended by your health care provider.  Limit stress.  Keep lights dim if  bright lights bother you and make your migraines worse. SEEK MEDICAL CARE IF:   You do not get relief from the medicines given to you.  You have a recurrence of pain.  You have a fever. SEEK IMMEDIATE MEDICAL CARE IF:  Your migraine becomes severe.  You have a stiff neck.  You have loss of vision.  You have muscular weakness or loss of muscle control.  You start losing your balance or have trouble walking.  You feel faint or pass out.  You have severe symptoms that are different from your first symptoms. MAKE SURE YOU:   Understand these instructions.  Will watch your condition.  Will get help right away if you are not doing well or get worse.   This information is not intended to replace advice given to you by your health care provider. Make sure you discuss any questions you have with your health care provider.   Document Released: 10/22/2000 Document Revised: 02/17/2014 Document Reviewed: 10/04/2012 Elsevier Interactive Patient Education [employer].

## 2014-11-25 NOTE — ED Notes (Addendum)
Pt states severe migranes for 3 weeks, states dizziness and feels faint, states she just had a miscarriage recently when all the headaches began, states some blurry vision and nausea, also states some sharp chest pains and teeth aches

## 2014-11-25 NOTE — ED Provider Notes (Signed)
[facility] Emergency Department Provider Note  ____________________________________________  Time seen: Approximately 7:05 PM  I have reviewed the triage vital signs and the nursing notes.   HISTORY  Chief Complaint Migraine    HPI Michelle Holland is a 29 y.o. female presents to the emergency department complaining of headache for 3 weeks. She states that she has a history of migraines and that this is been consistent with same however reports lasting longer than normal. She states that she has taken some medication that was prescribed for her from a neurologist "for 5 years ago" for relief but there was no relief this time. She denies any nausea/vomiting, blurred vision, numbness or tingling, chest pain or shortness of breath. She denies any upper respiratory complaints. She states the headache is "severe" that is constant that nothing alleviates it.She states that the pain is best described as a pressure sensation.     Past Medical History  Diagnosis Date  . Migraine   . Cervical cancer ([place])   . GERD (gastroesophageal reflux disease)     There are no active problems to display for this patient.   History reviewed. No pertinent past surgical history.  No current outpatient prescriptions on file.  Allergies Latex  History reviewed. No pertinent family history.  Social History Social History  Substance Use Topics  . Smoking status: Current Every Day Smoker  . Smokeless tobacco: None  . Alcohol Use: None    Review of Systems Constitutional: No fever/chills Eyes: No visual changes. ENT: No sore throat. Cardiovascular: Denies chest pain. Respiratory: Denies shortness of breath. Gastrointestinal: No abdominal pain.  No nausea, no vomiting.  No diarrhea.  No constipation. Genitourinary: Negative for dysuria. Musculoskeletal: Negative for back pain. Skin: Negative for rash. Neurological: Endorses a headache, but denies focal weakness  or numbness.  10-point ROS otherwise negative.  ____________________________________________   PHYSICAL EXAM:  VITAL SIGNS: ED Triage Vitals  Enc Vitals Group     BP 11/25/14 1706 100/67 mmHg     Pulse Rate 11/25/14 1706 108     Resp 11/25/14 1706 18     Temp 11/25/14 1706 99 F (37.2 C)     Temp Source 11/25/14 1706 Oral     SpO2 11/25/14 1706 99 %     Weight 11/25/14 1706 208 lb (94.348 kg)     Height 11/25/14 1706 4\' 10"  (1.473 m)     Head Cir --      Peak Flow --      Pain Score 11/25/14 1707 5     Pain Loc --      Pain Edu? --      Excl. in [place]? --     Constitutional: Alert and oriented. Well appearing and in no acute distress. Eyes: Conjunctivae are normal. PERRL. EOMI. Head: Atraumatic. Nose: No congestion/rhinnorhea. Mouth/Throat: Mucous membranes are moist.  Oropharynx non-erythematous. Neck: No stridor.   Cardiovascular: Normal rate, regular rhythm. Grossly normal heart sounds.  Good peripheral circulation. Respiratory: Normal respiratory effort.  No retractions. Lungs CTAB. Gastrointestinal: Soft and nontender. No distention. No abdominal bruits. No CVA tenderness. Musculoskeletal: No lower extremity tenderness nor edema.  No joint effusions. Neurologic:  Normal speech and language. No gross focal neurologic deficits are appreciated. No gait instability. Cranial nerves II through XII grossly intact. Grip strength upper extremities. Reflexes intact and sensation intact bilateral lower extremities. Skin:  Skin is warm, dry and intact. No rash noted. Psychiatric: Mood and affect are normal. Speech and behavior  are normal.  ____________________________________________   LABS (all labs ordered are listed, but only abnormal results are displayed)  Labs Reviewed  CBC - Abnormal; Notable for the following:    RBC 3.75 (*)    Hemoglobin 11.3 (*)    HCT 33.5 (*)    All other components within normal limits  BASIC METABOLIC PANEL - Abnormal; Notable for the  following:    Glucose, Bld 103 (*)    Calcium 8.4 (*)    All other components within normal limits  TROPONIN I   ____________________________________________  EKG  EKG reveals sinus tachycardia at a rate of 101 bpm.*Depression noted. ER, QRS, QT intervals are within normal limits. No Q waves or delta waves present. ____________________________________________  RADIOLOGY   ____________________________________________   PROCEDURES  Procedure(s) performed: None  Critical Care performed: No  ____________________________________________   INITIAL IMPRESSION / ASSESSMENT AND PLAN / ED COURSE  Pertinent labs & imaging results that were available during my care of the patient were reviewed by me and considered in my medical decision making (see chart for details).  Patient's history, symptoms, and physical exam are consistent with migraine headache. I advised the patient that we would give her 3 shots for alleviation of same. The patient should then follow-up with neurology for daily medication to include preventative as well as abortive medication. Patient verbalizes understanding and compliance with treatment plan.  During triage patient had endorse other complaints and laboratory results and EKG were reviewed by the provider. No acute findings were noted. Patient was not complaining of any other symptoms besides for migraine headache during interview. ____________________________________________   FINAL CLINICAL IMPRESSION(S) / ED DIAGNOSES  Final diagnoses:  Chronic migraine without aura without status migrainosus, not intractable      Darletta Moll, PA-C 11/25/14 1943  Ahmed Prima, MD 11/25/14 2045

## 2015-06-12 ENCOUNTER — Emergency Department
Admission: EM | Admit: 2015-06-12 | Discharge: 2015-06-12 | Disposition: A | Discharge status: Home / Self Care | Payer: Self-pay | Attending: Emergency Medicine | Admitting: Emergency Medicine

## 2015-06-12 DIAGNOSIS — K0889 Other specified disorders of teeth and supporting structures: Secondary | ICD-10-CM | POA: Insufficient documentation

## 2015-06-12 DIAGNOSIS — F17200 Nicotine dependence, unspecified, uncomplicated: Secondary | ICD-10-CM | POA: Insufficient documentation

## 2015-06-12 DIAGNOSIS — Z8541 Personal history of malignant neoplasm of cervix uteri: Secondary | ICD-10-CM | POA: Insufficient documentation

## 2015-06-12 MED ORDER — IBUPROFEN 800 MG PO TABS: 800.0000 mg | ORAL_TABLET | Freq: Three times a day (TID) | ORAL | Status: DC

## 2015-06-12 MED ORDER — AMOXICILLIN 500 MG PO CAPS: 500.0000 mg | ORAL_CAPSULE | Freq: Three times a day (TID) | ORAL | Status: DC

## 2015-06-12 MED ORDER — AMOXICILLIN 500 MG PO CAPS
500.0000 mg | ORAL_CAPSULE | Freq: Once | ORAL | Status: AC
  Administered 2015-06-12: 500 mg via ORAL
  Filled 2015-06-12: qty 1

## 2015-06-12 MED ORDER — TRAMADOL HCL 50 MG PO TABS: 50.0000 mg | ORAL_TABLET | Freq: Four times a day (QID) | ORAL | Status: DC | PRN

## 2015-06-12 NOTE — Discharge Instructions (Signed)
Take antibiotics as directed for the entire 10 days. Ibuprofen every 8 hours with food as needed for pain and inflammation.    OPTIONS FOR DENTAL FOLLOW UP CARE  [place] [facility] and [place] [email or web address] [phone])  [facility] [phone])  [place] [phone])  [place] [phone])  [facility] [phone]) This clinic caters to the indigent population and is on a lottery system. Location: [employer] [facility], [place], [place], [place] Clinic Hours: Wednesdays from 6pm - 9pm, patients seen by a lottery system. For dates, call or go to [email or web address] Services: Cleanings, fillings and simple extractions. Payment Options: DENTAL WORK IS FREE OF CHARGE. Bring proof of income or support. Best way to get seen: Arrive at 5:15 pm - this is a lottery, NOT first come/first serve, so arriving earlier will not increase your chances of being seen.     [facility] [phone] Select option 1 for emergencies   Location: [facility], [place], [address] [place] Clinic Hours: No walk-ins accepted - call the day before to schedule an appointment. Check in times are 9:30 am and 1:30 pm. Services: Simple extractions, temporary fillings, pulpectomy/pulp debridement, uncomplicated abscess drainage. Payment Options: PAYMENT IS DUE AT THE TIME OF SERVICE.  Fee is usually $100-200, additional surgical procedures (e.g. abscess drainage) may be extra. Cash, checks, Visa/MasterCard accepted.  Can file Medicaid if patient is covered for dental - patient should call case worker to check. No discount for [facility] patients. Best way to get seen: MUST call the day before and get onto the  schedule. Can usually be seen the next 1-2 days. No walk-ins accepted.     [place] [phone]   Location: [place], [place] [facility] Hours: M, W, Th, F 8am or 1:30pm, Tues 9a or 1:30 - first come/first served. Services: Simple extractions, temporary fillings, uncomplicated abscess drainage.  You do not need to be an [facility] resident. Payment Options: PAYMENT IS DUE AT THE TIME OF SERVICE. Dental insurance, otherwise sliding scale - bring proof of income or support. Depending on income and treatment needed, cost is usually $50-200. Best way to get seen: Arrive early as it is first come/first served.     [facility] [phone]   Location: [place] [facility] Hours: Mon-Thu 8a-5p Services: Most basic dental services including extractions and fillings. Payment Options: PAYMENT IS DUE AT THE TIME OF SERVICE. Sliding scale, up to 50% off - bring proof if income or support. Medicaid with dental option accepted. Best way to get seen: Call to schedule an appointment, can usually be seen within 2 weeks OR they will try to see walk-ins - show up at [place] or 2p (you may have to wait).     [facility] [place] RESIDENTS ONLY   Location: [facility], [place] [address], [place], [place] [address] Clinic Hours: By appointment only. Monday - Thursday 8am-5pm, Friday 8am-12pm Services: Cleanings, fillings, extractions. Payment Options: PAYMENT IS DUE AT THE TIME OF SERVICE. Cash, Visa or MasterCard. Sliding scale - $30 minimum per service. Best way to get seen: Come in to office, complete packet and make an appointment - need proof of income or support monies for each household member and proof of [facility] residence.  Usually takes about a month to get in.     [facility] [phone]    Location: [address], [place] [facility] Hours: Walk-in Urgent Care Dental Services are offered Monday-Friday mornings only. The numbers of emergencies accepted daily is limited to the number of providers available. Maximum 15 - Mondays, Wednesdays & Thursdays Maximum 10 - Tuesdays & Fridays Services: You do not need to be a [facility] resident to be seen for a dental emergency. Emergencies are defined as pain, swelling, abnormal bleeding, or dental trauma. Walkins will receive x-rays if needed. NOTE: Dental cleaning is not an emergency. Payment Options: PAYMENT IS DUE AT THE TIME OF SERVICE. Minimum co-pay is $40.00 for uninsured patients. Minimum co-pay is $3.00 for Medicaid with dental coverage. Dental Insurance is accepted and must be presented at time of visit. Medicare does not cover dental. Forms of payment: Cash, credit card, checks. Best way to get seen: If not previously registered with the clinic, walk-in dental registration begins at 7:15 am and is on a first come/first serve basis. If previously registered with the clinic, call to make an appointment.     [facility] [place] ONLY   Location: [address], [place], [place] Clinic Hours: Mon-Thu 10a-2p Services: Extractions only! Payment Options: FREE (donations accepted) - bring proof of income or support Best way to get seen: Call and schedule an appointment OR come at 8am on the 1st Monday of every month (except for holidays) when it is first come/first served.     [facility] [phone]   Location: [place], [place] Clinic Hours: Friday mornings Services, Payment Options, Best way to get seen: Call for info

## 2015-06-12 NOTE — ED Notes (Signed)
Pt in via triage with complaints of dental pain to left side.  Swelling to left side of face noted.  Pt reports taking tramadol to help with the pain.  No immediate distress at this time.

## 2015-06-12 NOTE — ED Provider Notes (Signed)
[facility] Emergency Department Provider Note   ____________________________________________  Time seen: Approximately 9:18 PM  I have reviewed the triage vital signs and the nursing notes.   HISTORY  Chief Complaint Abscess   HPI Ta Swana R Trivette is a 30 y.o. female is here with complaint of left-sided dental pain. Patient states that she began having pain 2 days ago and has noticed that the left side of her face has swollen some. She got some tramadol from a friend has been taking yet for pain. She does not have a dentist at this time and has not called to get an appointment. She denies any fever or chills. She is not having any difficulty breathing.Currently she rates her pain is 7/10.   Past Medical History  Diagnosis Date  . Migraine   . Cervical cancer ([place])   . GERD (gastroesophageal reflux disease)     There are no active problems to display for this patient.   No past surgical history on file.  Current Outpatient Rx  Name  Route  Sig  Dispense  Refill  . amoxicillin (AMOXIL) 500 MG capsule   Oral   Take 1 capsule (500 mg total) by mouth 3 (three) times daily.   30 capsule   0   . ibuprofen (ADVIL,MOTRIN) 800 MG tablet   Oral   Take 1 tablet (800 mg total) by mouth 3 (three) times daily.   30 tablet   0   . traMADol (ULTRAM) 50 MG tablet   Oral   Take 1 tablet (50 mg total) by mouth every 6 (six) hours as needed.   5 tablet   0     Allergies Latex  No family history on file.  Social History Social History  Substance Use Topics  . Smoking status: Current Every Day Smoker  . Smokeless tobacco: Not on file  . Alcohol Use: Not on file    Review of Systems Constitutional: No fever/chills Eyes: No visual changes. [id number] dental pain. Cardiovascular: Denies chest pain. Respiratory: Denies shortness of breath. Gastrointestinal:   No nausea, no vomiting.   kin: Negative for rash. Neurological: Negative for  headaches, focal weakness or numbness.  10-point ROS otherwise negative.  ____________________________________________   PHYSICAL EXAM:  VITAL SIGNS: ED Triage Vitals  Enc Vitals Group     BP 06/12/15 1959 122/72 mmHg     Pulse Rate 06/12/15 1959 98     Resp 06/12/15 1959 18     Temp 06/12/15 1959 99.1 F (37.3 C)     Temp Source 06/12/15 1959 Oral     SpO2 06/12/15 1959 100 %     Weight 06/12/15 1959 180 lb (81.647 kg)     Height 06/12/15 1959 4\' 10"  (1.473 m)     Head Cir --      Peak Flow --      Pain Score 06/12/15 2000 7     Pain Loc --      Pain Edu? --      Excl. in [place]? --     Constitutional: Alert and oriented. Well appearing and in no acute distress.Patient is laughing with a friend that is in the room. Eyes: Conjunctivae are normal. PERRL. EOMI. Head: Atraumatic. Nose: No congestion/rhinnorhea. Mouth/Throat: Mucous membranes are moist.  Oropharynx non-erythematous.There is no obvious abscess present however there is moderate edema/ tenderness on palpation of the gums with a tongue depressor left lower molars. Neck: No stridor.  Supple. Hematological/Lymphatic/Immunilogical: No cervical lymphadenopathy. Cardiovascular:  Normal rate, regular rhythm. Grossly normal heart sounds.  Good peripheral circulation. Respiratory: Normal respiratory effort.  No retractions. Lungs CTAB. Musculoskeletal: His upper and lower extremities without any difficulty and normal gait was noted. Neurologic:  Normal speech and language. No gross focal neurologic deficits are appreciated. No gait instability. Skin:  Skin is warm, dry and intact. No rash noted. Psychiatric: Mood and affect are normal. Speech and behavior are normal.  ____________________________________________   LABS (all labs ordered are listed, but only abnormal results are displayed)  Labs Reviewed - No data to display   PROCEDURES  Procedure(s) performed: None  Critical Care performed:  No  ____________________________________________   INITIAL IMPRESSION / ASSESSMENT AND PLAN / ED COURSE  Pertinent labs & imaging results that were available during my care of the patient were reviewed by me and considered in my medical decision making (see chart for details).  Patient was started on amoxicillin 500 mg 3 times a day for 10 days and ibuprofen as needed for pain and inflammation. She is also given tramadol one every 6 hours #5 no refill. Patient was given a list of dental clinics to call to make an appointment. ____________________________________________   FINAL CLINICAL IMPRESSION(S) / ED DIAGNOSES  Final diagnoses:  Pain, dental      NEW MEDICATIONS STARTED DURING THIS VISIT:  Discharge Medication List as of 06/12/2015  9:38 PM    START taking these medications   Details  amoxicillin (AMOXIL) 500 MG capsule Take 1 capsule (500 mg total) by mouth 3 (three) times daily., Starting 06/12/2015, Until Discontinued, Print    ibuprofen (ADVIL,MOTRIN) 800 MG tablet Take 1 tablet (800 mg total) by mouth 3 (three) times daily., Starting 06/12/2015, Until Discontinued, Print    traMADol (ULTRAM) 50 MG tablet Take 1 tablet (50 mg total) by mouth every 6 (six) hours as needed., Starting 06/12/2015, Until Discontinued, Print         Note:  This document was prepared using Dragon voice recognition software and may include unintentional dictation errors.    Johnn Hai, PA-C 06/12/15 2210  Nena Polio, MD 06/12/15 2219

## 2015-06-12 NOTE — ED Notes (Signed)
Pt in with co swelling and pain to left jaw and toothache.

## 2016-04-02 DIAGNOSIS — R1084 Generalized abdominal pain: Secondary | ICD-10-CM | POA: Diagnosis present

## 2016-04-02 DIAGNOSIS — F17200 Nicotine dependence, unspecified, uncomplicated: Secondary | ICD-10-CM | POA: Diagnosis not present

## 2016-04-02 DIAGNOSIS — Z791 Long term (current) use of non-steroidal anti-inflammatories (NSAID): Secondary | ICD-10-CM | POA: Insufficient documentation

## 2016-04-02 DIAGNOSIS — Z79899 Other long term (current) drug therapy: Secondary | ICD-10-CM | POA: Diagnosis not present

## 2016-04-02 DIAGNOSIS — K5900 Constipation, unspecified: Secondary | ICD-10-CM | POA: Insufficient documentation

## 2016-04-02 DIAGNOSIS — Z8541 Personal history of malignant neoplasm of cervix uteri: Secondary | ICD-10-CM | POA: Insufficient documentation

## 2016-04-03 ENCOUNTER — Encounter: Payer: Self-pay | Admitting: Emergency Medicine

## 2016-04-03 ENCOUNTER — Emergency Department
Admission: EM | Admit: 2016-04-03 | Discharge: 2016-04-03 | Disposition: A | Discharge status: Home / Self Care | Payer: BLUE CROSS/BLUE SHIELD | Attending: Emergency Medicine | Admitting: Emergency Medicine

## 2016-04-03 DIAGNOSIS — K5900 Constipation, unspecified: Secondary | ICD-10-CM

## 2016-04-03 DIAGNOSIS — R1084 Generalized abdominal pain: Secondary | ICD-10-CM

## 2016-04-03 LAB — COMPREHENSIVE METABOLIC PANEL
ALBUMIN: 4.3 g/dL (ref 3.5–5.0)
ALT: 13 U/L — AB (ref 14–54)
AST: 17 U/L (ref 15–41)
Alkaline Phosphatase: 45 U/L (ref 38–126)
Anion gap: 7 (ref 5–15)
BILIRUBIN TOTAL: 0.3 mg/dL (ref 0.3–1.2)
BUN: 14 mg/dL (ref 6–20)
CO2: 27 mmol/L (ref 22–32)
Calcium: 9.2 mg/dL (ref 8.9–10.3)
Chloride: 105 mmol/L (ref 101–111)
Creatinine, Ser: 0.75 mg/dL (ref 0.44–1.00)
GFR calc Af Amer: >60 mL/min (ref >60)
GFR calc non Af Amer: >60 mL/min (ref >60)
GLUCOSE: 79 mg/dL (ref 65–99)
Potassium: 3.9 mmol/L (ref 3.5–5.1)
SODIUM: 139 mmol/L (ref 135–145)
Total Protein: 7.4 g/dL (ref 6.5–8.1)

## 2016-04-03 LAB — URINALYSIS, COMPLETE (UACMP) WITH MICROSCOPIC
BILIRUBIN URINE: NEGATIVE
Glucose, UA: NEGATIVE mg/dL
HGB URINE DIPSTICK: NEGATIVE
Ketones, ur: NEGATIVE mg/dL
Leukocytes, UA: NEGATIVE
NITRITE: NEGATIVE
PROTEIN: NEGATIVE mg/dL
Specific Gravity, Urine: 1.016 (ref 1.005–1.030)
pH: 6 (ref 5.0–8.0)

## 2016-04-03 LAB — CBC
HCT: 35.7 % (ref 35.0–47.0)
HEMOGLOBIN: 12.1 g/dL (ref 12.0–16.0)
MCH: 30.8 pg (ref 26.0–34.0)
MCHC: 33.8 g/dL (ref 32.0–36.0)
MCV: 91.1 fL (ref 80.0–100.0)
Platelets: 184 10*3/uL (ref 150–440)
RBC: 3.92 MIL/uL (ref 3.80–5.20)
RDW: 13.6 % (ref 11.5–14.5)
WBC: 10.3 10*3/uL (ref 3.6–11.0)

## 2016-04-03 LAB — LIPASE, BLOOD: Lipase: 22 U/L (ref 11–51)

## 2016-04-03 LAB — POCT PREGNANCY, URINE: PREG TEST UR: NEGATIVE

## 2016-04-03 NOTE — ED Notes (Signed)
Pt discharged to home.  Discharge instructions reviewed.  Verbalized understanding.  No questions or concerns at this time.  Teach back verified.  Pt in NAD.  No items left in ED.   

## 2016-04-03 NOTE — Discharge Instructions (Signed)
You were seen in the emergency department today for abdominal pain we believe is caused by constipation.  We recommend that you use one or more of the following over-the-counter medications in the order described:   1)  Colace (or Dulcolax) 100 mg:  This is a stool softener, and you may take it once or twice a day as needed. 2)  Senna tablets:  This is a bowel stimulant that will help "push" out your stool. It is the next step to add after you have tried a stool softener. 3)  Miralax (powder):  This medication works by drawing additional fluid into your intestines and helps to flush out your stool.  Mix the powder with water or juice according to label instructions.  It may help if the Colace and Senna are not sufficient, but you must be sure to use the recommended amount of water or juice when you mix up the powder. Remember that narcotic pain medications are constipating, so avoid them or minimize their use.  Drink plenty of fluids.  You can also look for a bottle of magnesium citrate at the pharmcy or grocery store.  Ask the pharmcist about it if you have any questions.  This drink is very effective and causing bowel movements, so we suggest you wait until you get home before you drink it.  Please return to the Emergency Department immediately if you develop new or worsening symptoms that concern you, such as (but not limited to) fever > 101 degrees, severe abdominal pain, or persistent vomiting.

## 2016-04-03 NOTE — ED Provider Notes (Signed)
[facility] Emergency Department Provider Note  ____________________________________________   First MD Initiated Contact with Patient 04/03/16 0126     (approximate)  I have reviewed the triage vital signs and the nursing notes.   HISTORY  Chief Complaint Abdominal Pain    HPI Michelle Holland is a 31 y.o. female who presents for evaluation of gradual onset abdominal comfort and distention for about a month.  She states that she has not been able to come in yet because she never has childcare until tonight.  She states that it seems to be a little bit worse after she eats because she feels more bloated.  Nothing makes it better.  It feels like a cramping, aching, bloating sensation.  She states that she thinks she is constipated and she has had a problem with constipation for her whole life.  She has tried taking a stool softener once but she did not feel like it helped.It does not seem to matter what she eats.  She describes the symptoms as severe.   Past Medical History:  Diagnosis Date  . Cervical cancer ([place])   . GERD (gastroesophageal reflux disease)   . Migraine     There are no active problems to display for this patient.   No past surgical history on file.  Prior to Admission medications   Medication Sig Start Date End Date Taking? Authorizing Provider  amoxicillin (AMOXIL) 500 MG capsule Take 1 capsule (500 mg total) by mouth 3 (three) times daily. 06/12/15   Johnn Hai, PA-C  ibuprofen (ADVIL,MOTRIN) 800 MG tablet Take 1 tablet (800 mg total) by mouth 3 (three) times daily. 06/12/15   Johnn Hai, PA-C  traMADol (ULTRAM) 50 MG tablet Take 1 tablet (50 mg total) by mouth every 6 (six) hours as needed. 06/12/15   Johnn Hai, PA-C    Allergies Latex and Yeast-related products  No family history on file.  Social History Social History  Substance Use Topics  . Smoking status: Current Every Day Smoker  . Smokeless  tobacco: Not on file  . Alcohol use Not on file    Review of Systems Constitutional: No fever/chills Eyes: No visual changes. ENT: No sore throat. Cardiovascular: Denies chest pain. Respiratory: Denies shortness of breath. Gastrointestinal: Generalized abd discomfort and distention.  No nausea, no vomiting.  No diarrhea.  No constipation. Genitourinary: Negative for dysuria. Musculoskeletal: Negative for back pain. Skin: Negative for rash. Neurological: Negative for headaches, focal weakness or numbness.  10-point ROS otherwise negative.  ____________________________________________   PHYSICAL EXAM:  VITAL SIGNS: ED Triage Vitals [04/03/16 0004]  Enc Vitals Group     BP 137/87     Pulse Rate (!) 8     Resp 20     Temp 98.6 F (37 C)     Temp Source Oral     SpO2 100 %     Weight 210 lb (95.3 kg)     Height 4\' 10"  (1.473 m)     Head Circumference      Peak Flow      Pain Score 5     Pain Loc      Pain Edu?      Excl. in [place]?     Constitutional: Alert and oriented. Well appearing and in no acute distress. Eyes: Conjunctivae are normal. PERRL. EOMI. Head: Atraumatic. Nose: No congestion/rhinnorhea. Mouth/Throat: Mucous membranes are moist. Neck: No stridor.  No meningeal signs.   Cardiovascular: Normal rate, regular  rhythm. Good peripheral circulation. Grossly normal heart sounds. Respiratory: Normal respiratory effort.  No retractions. Lungs CTAB. Gastrointestinal: Obese, soft, non-tender throughout.   Musculoskeletal: No lower extremity tenderness nor edema. No gross deformities of extremities. Neurologic:  Normal speech and language. No gross focal neurologic deficits are appreciated.  Skin:  Skin is warm, dry and intact. No rash noted. Psychiatric: Mood and affect are normal. Speech and behavior are normal.  ____________________________________________   LABS (all labs ordered are listed, but only abnormal results are displayed)  Labs Reviewed    COMPREHENSIVE METABOLIC PANEL - Abnormal; Notable for the following:       Result Value   ALT 13 (*)    All other components within normal limits  URINALYSIS, COMPLETE (UACMP) WITH MICROSCOPIC - Abnormal; Notable for the following:    Color, Urine YELLOW (*)    APPearance CLEAR (*)    Bacteria, UA RARE (*)    Squamous Epithelial / LPF 0-5 (*)    All other components within normal limits  CBC  LIPASE, BLOOD  POC URINE PREG, ED  POCT PREGNANCY, URINE   ____________________________________________  EKG  None - EKG not ordered by ED physician ____________________________________________  RADIOLOGY   No results found.  ____________________________________________   PROCEDURES  Procedure(s) performed:   Procedures   Critical Care performed: No ____________________________________________   INITIAL IMPRESSION / ASSESSMENT AND PLAN / ED COURSE  Pertinent labs & imaging results that were available during my care of the patient were reviewed by me and considered in my medical decision making (see chart for details).  The patient has a reassuring workup.  She has no lab abnormalities and normal vital signs (heart rate was 80 in triage, not 8).  She has no tenderness to palpation of her abdomen.  I agreed with her that she most likely is constipated given that she only has small bowel movements every couple of days.  I encouraged her to try my usual customary recommendations for over-the-counter remedies including stool softeners, senna, and MiraLAX.  I also told her about magnesium citrate which is also available over-the-counter.    I gave my usual and customary return precautions.        ____________________________________________  FINAL CLINICAL IMPRESSION(S) / ED DIAGNOSES  Final diagnoses:  Generalized abdominal pain  Constipation, unspecified constipation type     MEDICATIONS GIVEN DURING THIS VISIT:  Medications - No data to display   NEW OUTPATIENT  MEDICATIONS STARTED DURING THIS VISIT:  New Prescriptions   No medications on file    Modified Medications   No medications on file    Discontinued Medications   No medications on file     Note:  This document was prepared using Dragon voice recognition software and may include unintentional dictation errors.    Hinda Kehr, MD 04/03/16 [phone]

## 2016-04-03 NOTE — ED Triage Notes (Signed)
Pt in with co upper abd for a month, states feels like cramping. Hx of constipation had small BM today, states not any different today just had child care.

## 2016-10-25 ENCOUNTER — Encounter: Payer: Self-pay | Admitting: Emergency Medicine

## 2016-10-25 ENCOUNTER — Emergency Department
Admission: EM | Admit: 2016-10-25 | Discharge: 2016-10-25 | Disposition: A | Discharge status: Home / Self Care | Payer: BLUE CROSS/BLUE SHIELD | Attending: Student in an Organized Health Care Education/Training Program | Admitting: Student in an Organized Health Care Education/Training Program

## 2016-10-25 DIAGNOSIS — F17210 Nicotine dependence, cigarettes, uncomplicated: Secondary | ICD-10-CM | POA: Insufficient documentation

## 2016-10-25 DIAGNOSIS — R110 Nausea: Secondary | ICD-10-CM | POA: Diagnosis not present

## 2016-10-25 DIAGNOSIS — R109 Unspecified abdominal pain: Secondary | ICD-10-CM | POA: Diagnosis not present

## 2016-10-25 DIAGNOSIS — Z79899 Other long term (current) drug therapy: Secondary | ICD-10-CM | POA: Diagnosis not present

## 2016-10-25 DIAGNOSIS — R350 Frequency of micturition: Secondary | ICD-10-CM

## 2016-10-25 LAB — COMPREHENSIVE METABOLIC PANEL
ALK PHOS: 39 U/L (ref 38–126)
ALT: 16 U/L (ref 14–54)
ANION GAP: 9 (ref 5–15)
AST: 22 U/L (ref 15–41)
Albumin: 4.4 g/dL (ref 3.5–5.0)
BILIRUBIN TOTAL: 0.6 mg/dL (ref 0.3–1.2)
BUN: 8 mg/dL (ref 6–20)
CALCIUM: 9.6 mg/dL (ref 8.9–10.3)
CO2: 27 mmol/L (ref 22–32)
Chloride: 101 mmol/L (ref 101–111)
Creatinine, Ser: 0.68 mg/dL (ref 0.44–1.00)
GFR calc non Af Amer: >60 mL/min (ref >60)
Glucose, Bld: 93 mg/dL (ref 65–99)
Potassium: 3.5 mmol/L (ref 3.5–5.1)
SODIUM: 137 mmol/L (ref 135–145)
TOTAL PROTEIN: 8 g/dL (ref 6.5–8.1)

## 2016-10-25 LAB — URINALYSIS, COMPLETE (UACMP) WITH MICROSCOPIC
Bilirubin Urine: NEGATIVE
GLUCOSE, UA: NEGATIVE mg/dL
HGB URINE DIPSTICK: NEGATIVE
KETONES UR: NEGATIVE mg/dL
NITRITE: NEGATIVE
PROTEIN: NEGATIVE mg/dL
Specific Gravity, Urine: 1.014 (ref 1.005–1.030)
pH: 7 (ref 5.0–8.0)

## 2016-10-25 LAB — CBC
HCT: 40 % (ref 35.0–47.0)
HEMOGLOBIN: 13.7 g/dL (ref 12.0–16.0)
MCH: 32.1 pg (ref 26.0–34.0)
MCHC: 34.3 g/dL (ref 32.0–36.0)
MCV: 93.6 fL (ref 80.0–100.0)
Platelets: 160 10*3/uL (ref 150–440)
RBC: 4.27 MIL/uL (ref 3.80–5.20)
RDW: 13.8 % (ref 11.5–14.5)
WBC: 7.5 10*3/uL (ref 3.6–11.0)

## 2016-10-25 LAB — LIPASE, BLOOD: Lipase: 18 U/L (ref 11–51)

## 2016-10-25 LAB — POCT PREGNANCY, URINE: Preg Test, Ur: NEGATIVE

## 2016-10-25 MED ORDER — PROMETHAZINE HCL 12.5 MG PO TABS: 12.5000 mg | ORAL_TABLET | Freq: Four times a day (QID) | ORAL | 0 refills | Status: DC | PRN

## 2016-10-25 MED ORDER — NITROFURANTOIN MONOHYD MACRO 100 MG PO CAPS: 100.0000 mg | ORAL_CAPSULE | Freq: Two times a day (BID) | ORAL | 0 refills | Status: AC

## 2016-10-25 NOTE — ED Provider Notes (Signed)
[facility] Emergency Department Provider Note    First MD Initiated Contact with Patient 10/25/16 2033     (approximate)  I have reviewed the triage vital signs and the nursing notes.   HISTORY  Chief Complaint Abdominal Pain; Nausea; and Urinary Frequency    HPI Michelle Holland is a 31 y.o. female presents with 1 week of increased urinary frequency hot flashes, stomach cramps intermittent nausea for the past week. Last cycle was 5 weeks ago patient denies that she is pregnant. Denies any flank pain. No chest pain or shortness of breath. No fevers.   Past Medical History:  Diagnosis Date  . Cervical cancer ([place])   . GERD (gastroesophageal reflux disease)   . Migraine    History reviewed. No pertinent family history. History reviewed. No pertinent surgical history. There are no active problems to display for this patient.     Prior to Admission medications   Medication Sig Start Date End Date Taking? Authorizing Provider  amoxicillin (AMOXIL) 500 MG capsule Take 1 capsule (500 mg total) by mouth 3 (three) times daily. 06/12/15   Johnn Hai, PA-C  ibuprofen (ADVIL,MOTRIN) 800 MG tablet Take 1 tablet (800 mg total) by mouth 3 (three) times daily. 06/12/15   Johnn Hai, PA-C  traMADol (ULTRAM) 50 MG tablet Take 1 tablet (50 mg total) by mouth every 6 (six) hours as needed. 06/12/15   Johnn Hai, PA-C    Allergies Latex and Yeast-related products    Social History Social History  Substance Use Topics  . Smoking status: Current Every Day Smoker    Packs/day: 0.25    Types: Cigarettes  . Smokeless tobacco: Never Used  . Alcohol use Yes    Review of Systems Patient denies headaches, rhinorrhea, blurry vision, numbness, shortness of breath, chest pain, edema, cough, abdominal pain, nausea, vomiting, diarrhea, dysuria, fevers, rashes or hallucinations unless otherwise stated above in  HPI. ____________________________________________   PHYSICAL EXAM:  VITAL SIGNS: Vitals:   10/25/16 1632  BP: (!) 159/87  Pulse: 87  Resp: 18  Temp: 98.2 F (36.8 C)  SpO2: 100%    Constitutional: Alert and oriented. Well appearing and in no acute distress. Eyes: Conjunctivae are normal.  Head: Atraumatic. Nose: No congestion/rhinnorhea. Mouth/Throat: Mucous membranes are moist.   Neck: No stridor. Painless ROM.  Cardiovascular: Normal rate, regular rhythm. Grossly normal heart sounds.  Good peripheral circulation. Respiratory: Normal respiratory effort.  No retractions. Lungs CTAB. Gastrointestinal: Soft and nontender. No distention. No abdominal bruits. No CVA tenderness. Musculoskeletal: No lower extremity tenderness nor edema.  No joint effusions. Neurologic:  Normal speech and language. No gross focal neurologic deficits are appreciated. No facial droop Skin:  Skin is warm, dry and intact. No rash noted. Psychiatric: Mood and affect are normal. Speech and behavior are normal.  ____________________________________________   LABS (all labs ordered are listed, but only abnormal results are displayed)  Results for orders placed or performed during the hospital encounter of 10/25/16 (from the past 24 hour(s))  Pregnancy, urine POC     Status: None   Collection Time: 10/25/16  4:40 PM  Result Value Ref Range   Preg Test, Ur NEGATIVE NEGATIVE  Lipase, blood     Status: None   Collection Time: 10/25/16  4:42 PM  Result Value Ref Range   Lipase 18 11 - 51 U/L  Comprehensive metabolic panel     Status: None   Collection Time: 10/25/16  4:42 PM  Result Value Ref Range   Sodium 137 135 - 145 mmol/L   Potassium 3.5 3.5 - 5.1 mmol/L   Chloride 101 101 - 111 mmol/L   CO2 27 22 - 32 mmol/L   Glucose, Bld 93 65 - 99 mg/dL   BUN 8 6 - 20 mg/dL   Creatinine, Ser 0.68 0.44 - 1.00 mg/dL   Calcium 9.6 8.9 - 10.3 mg/dL   Total Protein 8.0 6.5 - 8.1 g/dL   Albumin 4.4 3.5 - 5.0  g/dL   AST 22 15 - 41 U/L   ALT 16 14 - 54 U/L   Alkaline Phosphatase 39 38 - 126 U/L   Total Bilirubin 0.6 0.3 - 1.2 mg/dL   GFR calc non Af Amer >60 >60 mL/min   GFR calc Af Amer >60 >60 mL/min   Anion gap 9 5 - 15  CBC     Status: None   Collection Time: 10/25/16  4:42 PM  Result Value Ref Range   WBC 7.5 3.6 - 11.0 K/uL   RBC 4.27 3.80 - 5.20 MIL/uL   Hemoglobin 13.7 12.0 - 16.0 g/dL   HCT 40.0 35.0 - 47.0 %   MCV 93.6 80.0 - 100.0 fL   MCH 32.1 26.0 - 34.0 pg   MCHC 34.3 32.0 - 36.0 g/dL   RDW 13.8 11.5 - 14.5 %   Platelets 160 150 - 440 K/uL  Urinalysis, Complete w Microscopic     Status: Abnormal   Collection Time: 10/25/16  4:42 PM  Result Value Ref Range   Color, Urine YELLOW (A) YELLOW   APPearance HAZY (A) CLEAR   Specific Gravity, Urine 1.014 1.005 - 1.030   pH 7.0 5.0 - 8.0   Glucose, UA NEGATIVE NEGATIVE mg/dL   Hgb urine dipstick NEGATIVE NEGATIVE   Bilirubin Urine NEGATIVE NEGATIVE   Ketones, ur NEGATIVE NEGATIVE mg/dL   Protein, ur NEGATIVE NEGATIVE mg/dL   Nitrite NEGATIVE NEGATIVE   Leukocytes, UA TRACE (A) NEGATIVE   RBC / HPF 0-5 0 - 5 RBC/hpf   WBC, UA 0-5 0 - 5 WBC/hpf   Bacteria, UA RARE (A) NONE SEEN   Squamous Epithelial / LPF 6-30 (A) NONE SEEN   Mucus PRESENT    ____________________________________________   ____________________________________________  RADIOLOGY   ____________________________________________   PROCEDURES  Procedure(s) performed:  Procedures    Critical Care performed: no ____________________________________________   INITIAL IMPRESSION / ASSESSMENT AND PLAN / ED COURSE  Pertinent labs & imaging results that were available during my care of the patient were reviewed by me and considered in my medical decision making (see chart for details).  DDX: uti, pregnancy, enteritis, gastritis  Michelle Holland is a 31 y.o. who presents to the ED with symptoms as described above. She is well appearing and  afebrile and hemodynamically stable. Her abdominal exam is soft and benign. Patient is not pregnant. Blood work is reassuring. We will start patient empirically for urinary tract infection given her increased urinary frequency. Patient will be given oral medications for nausea.  Have discussed with the patient and available family all diagnostics and treatments performed thus far and all questions were answered to the best of my ability. The patient demonstrates understanding and agreement with plan.       ____________________________________________   FINAL CLINICAL IMPRESSION(S) / ED DIAGNOSES  Final diagnoses:  Urinary frequency      NEW MEDICATIONS STARTED DURING THIS VISIT:  New Prescriptions   No medications on file  Note:  This document was prepared using Dragon voice recognition software and may include unintentional dictation errors.    Merlyn Lot, MD 10/25/16 2042

## 2016-10-25 NOTE — ED Triage Notes (Signed)
Pt reports stomach cramps, hot flashes, and urinary frequency since yesterday. Pt also reports nausea recently but states this is the first day she hasn't been nauseated. Pt c/o "i feel like my blood getting warm"

## 2017-04-22 ENCOUNTER — Emergency Department
Admission: EM | Admit: 2017-04-22 | Discharge: 2017-04-23 | Disposition: A | Discharge status: Home / Self Care | Payer: BLUE CROSS/BLUE SHIELD | Attending: Emergency Medicine | Admitting: Emergency Medicine

## 2017-04-22 DIAGNOSIS — F17210 Nicotine dependence, cigarettes, uncomplicated: Secondary | ICD-10-CM | POA: Insufficient documentation

## 2017-04-22 DIAGNOSIS — Z3A11 11 weeks gestation of pregnancy: Secondary | ICD-10-CM | POA: Insufficient documentation

## 2017-04-22 DIAGNOSIS — Z79899 Other long term (current) drug therapy: Secondary | ICD-10-CM | POA: Insufficient documentation

## 2017-04-22 DIAGNOSIS — K5641 Fecal impaction: Secondary | ICD-10-CM

## 2017-04-22 DIAGNOSIS — K5904 Chronic idiopathic constipation: Secondary | ICD-10-CM | POA: Insufficient documentation

## 2017-04-22 DIAGNOSIS — O99611 Diseases of the digestive system complicating pregnancy, first trimester: Secondary | ICD-10-CM | POA: Diagnosis not present

## 2017-04-22 MED ORDER — LACTULOSE 10 GM/15ML PO SOLN
20.0000 g | Freq: Once | ORAL | Status: AC
  Administered 2017-04-23: 20 g via ORAL
  Filled 2017-04-22: qty 30

## 2017-04-22 MED ORDER — GLYCERIN (LAXATIVE) 2.1 G RE SUPP
1.0000 | Freq: Once | RECTAL | Status: DC
  Filled 2017-04-22: qty 1

## 2017-04-22 NOTE — ED Triage Notes (Signed)
Patient reports she is [redacted] weeks pregnant. Patient c/o constipation. Patient reports las BM 3 days ago. Patient reports hx of constipation issues. Patient c/o rectal pain.

## 2017-04-23 MED ORDER — LACTULOSE 20 G PO PACK: 20.0000 g | PACK | Freq: Two times a day (BID) | ORAL | 0 refills | Status: DC

## 2017-04-23 NOTE — Discharge Instructions (Signed)
You have been treated for constipation and fecal impaction. Take the prescription lactulose as directed, for constipation. You may use OTC enemas and suppositories, as needed. Increase your fiber intake with fiber-rich foods. Avoid cheeses, meats, and breads when bowels are slow to empty. See your provider for ongoing symptoms.

## 2017-04-23 NOTE — ED Provider Notes (Signed)
[facility] Emergency Department Provider Note ____________________________________________  Time seen: 2209  I have reviewed the triage vital signs and the nursing notes.  HISTORY  Chief Complaint  Constipation  HPI Michelle Holland is a 32 y.o. female presents to the ED accompanied by family.  Patient is [redacted] weeks gestation with a single pregnancy, with complaints of constipation.  She describes her last bowel movement was about 3 days prior, when she passed several small firm pellets of stool.  She gives a history of constipation issues, but it as she typically only stools about twice a week.  She reports symptoms have worsened since confirmation of her pregnancy.  She reports pain and pressure at the rectum with attempts. She has noted watery stool with attempts to pass stool, but no firm stool.  She denies any bright red blood, hemorrhoids, or vomiting.  Past Medical History:  Diagnosis Date  . Cervical cancer ([place])   . GERD (gastroesophageal reflux disease)   . Migraine     There are no active problems to display for this patient.   Past Surgical History:  Procedure Laterality Date  . DILATION AND CURETTAGE, DIAGNOSTIC / THERAPEUTIC      Prior to Admission medications   Medication Sig Start Date End Date Taking? Authorizing Provider  amoxicillin (AMOXIL) 500 MG capsule Take 1 capsule (500 mg total) by mouth 3 (three) times daily. 06/12/15   Johnn Hai, PA-C  ibuprofen (ADVIL,MOTRIN) 800 MG tablet Take 1 tablet (800 mg total) by mouth 3 (three) times daily. 06/12/15   Johnn Hai, PA-C  lactulose (CEPHULAC) 20 g packet Take 1 packet (20 g total) by mouth 2 (two) times daily. 04/23/17   Arleigh Dicola, Dannielle Karvonen, PA-C  promethazine (PHENERGAN) 12.5 MG tablet Take 1 tablet (12.5 mg total) by mouth every 6 (six) hours as needed for nausea or vomiting. 10/25/16   Merlyn Lot, MD  traMADol (ULTRAM) 50 MG tablet Take 1 tablet (50 mg total) by  mouth every 6 (six) hours as needed. 06/12/15   Johnn Hai, PA-C    Allergies Latex and Yeast-related products  No family history on file.  Social History Social History   Tobacco Use  . Smoking status: Current Every Day Smoker    Packs/day: 0.25    Types: Cigarettes  . Smokeless tobacco: Never Used  Substance Use Topics  . Alcohol use: Yes  . Drug use: No    Review of Systems  Constitutional: Negative for fever. Cardiovascular: Negative for chest pain. Respiratory: Negative for shortness of breath. Gastrointestinal: Negative for abdominal pain, vomiting and diarrhea. Reports constipation and rectal pressure.  Genitourinary: Negative for dysuria. Musculoskeletal: Negative for back pain. Skin: Negative for rash. Neurological: Negative for headaches, focal weakness or numbness. ____________________________________________  PHYSICAL EXAM:  VITAL SIGNS: ED Triage Vitals  Enc Vitals Group     BP 04/22/17 2137 129/81     Pulse Rate 04/22/17 2137 99     Resp --      Temp 04/22/17 2137 98.7 F (37.1 C)     Temp Source 04/22/17 2137 Oral     SpO2 04/22/17 2137 100 %     Weight 04/22/17 2137 188 lb (85.3 kg)     Height 04/22/17 2137 4\' 10"  (1.473 m)     Head Circumference --      Peak Flow --      Pain Score 04/22/17 2142 6     Pain Loc --  Pain Edu? --      Excl. in [place]? --     Constitutional: Alert and oriented. Well appearing and in no distress. Head: Normocephalic and atraumatic. Cardiovascular: Normal rate, regular rhythm. Normal distal pulses. Respiratory: Normal respiratory effort. No wheezes/rales/rhonchi. Gastrointestinal: Soft and nontender. No distention. Normoactive bowel sounds x 4. Normal rectal tone. Soft stool noted in the rectum. Brown stool removed digitally prior to suppository insertion.  Musculoskeletal: Nontender with normal range of motion in all extremities.  Neurologic:  Normal gait without ataxia. Normal speech and language. No  gross focal neurologic deficits are appreciated. Skin:  Skin is warm, dry and intact. No rash noted. ____________________________________________  PROCEDURES  Procedures Lactulose 20g PO Glycerin suppository 2.1 g PR ____________________________________________  INITIAL IMPRESSION / ASSESSMENT AND PLAN / ED COURSE  Patient reports improvement of her symptoms after manual disimpaction and glycerin suppository.  She has had some soft stool passage in the bedside commode.  Patient is inclined to discharge home at this time noting she has decreased pain, pressure and discomfort.  She is discharged with a prescription for lactulose to dose as directed for ongoing patient.  She will increase her fiber intake and follow with primary provider as needed.  Return precautions have been reviewed. ____________________________________________  FINAL CLINICAL IMPRESSION(S) / ED DIAGNOSES  Final diagnoses:  Fecal impaction in rectum [facility])  Chronic idiopathic constipation      Carmie End, Dannielle Karvonen, PA-C 04/23/17 0047    Orbie Pyo, MD 04/23/17 [phone]

## 2017-05-05 ENCOUNTER — Emergency Department
Admission: EM | Admit: 2017-05-05 | Discharge: 2017-05-05 | Disposition: A | Discharge status: Home / Self Care | Payer: BLUE CROSS/BLUE SHIELD | Attending: Emergency Medicine | Admitting: Emergency Medicine

## 2017-05-05 ENCOUNTER — Other Ambulatory Visit: Payer: Self-pay

## 2017-05-05 ENCOUNTER — Encounter: Payer: Self-pay | Admitting: Emergency Medicine

## 2017-05-05 ENCOUNTER — Emergency Department: Payer: BLUE CROSS/BLUE SHIELD

## 2017-05-05 DIAGNOSIS — Z8541 Personal history of malignant neoplasm of cervix uteri: Secondary | ICD-10-CM | POA: Insufficient documentation

## 2017-05-05 DIAGNOSIS — N390 Urinary tract infection, site not specified: Secondary | ICD-10-CM

## 2017-05-05 DIAGNOSIS — Z3A13 13 weeks gestation of pregnancy: Secondary | ICD-10-CM

## 2017-05-05 DIAGNOSIS — O2342 Unspecified infection of urinary tract in pregnancy, second trimester: Secondary | ICD-10-CM | POA: Diagnosis not present

## 2017-05-05 DIAGNOSIS — Z91040 Latex allergy status: Secondary | ICD-10-CM | POA: Insufficient documentation

## 2017-05-05 DIAGNOSIS — O368121 Decreased fetal movements, second trimester, fetus 1: Secondary | ICD-10-CM | POA: Diagnosis present

## 2017-05-05 DIAGNOSIS — O99332 Smoking (tobacco) complicating pregnancy, second trimester: Secondary | ICD-10-CM | POA: Diagnosis not present

## 2017-05-05 DIAGNOSIS — F17210 Nicotine dependence, cigarettes, uncomplicated: Secondary | ICD-10-CM | POA: Insufficient documentation

## 2017-05-05 DIAGNOSIS — O368120 Decreased fetal movements, second trimester, not applicable or unspecified: Secondary | ICD-10-CM

## 2017-05-05 LAB — URINALYSIS, COMPLETE (UACMP) WITH MICROSCOPIC
Bilirubin Urine: NEGATIVE
GLUCOSE, UA: NEGATIVE mg/dL
HGB URINE DIPSTICK: NEGATIVE
Ketones, ur: NEGATIVE mg/dL
LEUKOCYTES UA: NEGATIVE
NITRITE: NEGATIVE
PH: 6 (ref 5.0–8.0)
Protein, ur: NEGATIVE mg/dL
Specific Gravity, Urine: 1.012 (ref 1.005–1.030)

## 2017-05-05 LAB — POCT PREGNANCY, URINE: PREG TEST UR: POSITIVE — AB

## 2017-05-05 LAB — HCG, QUANTITATIVE, PREGNANCY: hCG, Beta Chain, Quant, S: 131647 m[IU]/mL — ABNORMAL HIGH (ref <5)

## 2017-05-05 MED ORDER — NITROFURANTOIN MONOHYD MACRO 100 MG PO CAPS: 100.0000 mg | ORAL_CAPSULE | Freq: Two times a day (BID) | ORAL | 0 refills | Status: AC

## 2017-05-05 NOTE — ED Notes (Signed)
Patient transported to Ultrasound 

## 2017-05-05 NOTE — ED Notes (Signed)
FIRST NURSE NOTE:  Pt to ed with c/o decreased fetal movement.  Pt states she is approx [redacted] weeks pregnant.  G2P1.

## 2017-05-05 NOTE — ED Triage Notes (Signed)
Says she is almost [redacted] weeks pregnant.  Says she started feeling fetal movement about 10 weeks, but now has not felt movement for a few days. No pain.  Says she had appt with [facility] today,b ut they called and canceled

## 2017-05-05 NOTE — ED Provider Notes (Signed)
[facility] Emergency Department Provider Note ____________________________________________   First MD Initiated Contact with Patient 05/05/17 1523     (approximate)  I have reviewed the triage vital signs and the nursing notes.   HISTORY  Chief Complaint No chief complaint on file.    HPI Michelle Holland is a 32 y.o. female G3 P1 at 13 weeks by dates who presents with decreased fetal movement for approximately last week, gradual onset, persistent course, and not associated with any other symptoms.  Patient denies vaginal bleeding, pain, dysuria, weakness, or any other acute symptoms.  She has been constipated and started lactulose a few weeks ago.  She states the reason she came in today is because she [facility] and was supposed to get her first ultrasound today, but then the canceled.   Past Medical History:  Diagnosis Date  . Cervical cancer ([place])   . GERD (gastroesophageal reflux disease)   . Migraine     There are no active problems to display for this patient.   Past Surgical History:  Procedure Laterality Date  . DILATION AND CURETTAGE, DIAGNOSTIC / THERAPEUTIC      Prior to Admission medications   Medication Sig Start Date End Date Taking? Authorizing Provider  amoxicillin (AMOXIL) 500 MG capsule Take 1 capsule (500 mg total) by mouth 3 (three) times daily. 06/12/15   Johnn Hai, PA-C  ibuprofen (ADVIL,MOTRIN) 800 MG tablet Take 1 tablet (800 mg total) by mouth 3 (three) times daily. 06/12/15   Johnn Hai, PA-C  lactulose (CEPHULAC) 20 g packet Take 1 packet (20 g total) by mouth 2 (two) times daily. 04/23/17   Menshew, Dannielle Karvonen, PA-C  nitrofurantoin, macrocrystal-monohydrate, (MACROBID) 100 MG capsule Take 1 capsule (100 mg total) by mouth 2 (two) times daily for 7 days. 05/05/17 05/12/17  Arta Silence, MD  promethazine (PHENERGAN) 12.5 MG tablet Take 1 tablet (12.5 mg total) by  mouth every 6 (six) hours as needed for nausea or vomiting. 10/25/16   Merlyn Lot, MD  traMADol (ULTRAM) 50 MG tablet Take 1 tablet (50 mg total) by mouth every 6 (six) hours as needed. 06/12/15   Johnn Hai, PA-C    Allergies Latex and Yeast-related products  No family history on file.  Social History Social History   Tobacco Use  . Smoking status: Current Every Day Smoker    Packs/day: 0.25    Types: Cigarettes  . Smokeless tobacco: Never Used  Substance Use Topics  . Alcohol use: Yes  . Drug use: No    Review of Systems  Constitutional: No fever. Eyes: No redness. ENT: No sore throat. Cardiovascular: Denies chest pain. Respiratory: Denies shortness of breath. Gastrointestinal: No abdominal pain.  Genitourinary: Negative for dysuria or vaginal bleeding.  Musculoskeletal: Negative for back pain. Skin: Negative for rash. Neurological: Negative for headache.   ____________________________________________   PHYSICAL EXAM:  VITAL SIGNS: ED Triage Vitals  Enc Vitals Group     BP 05/05/17 1341 109/73     Pulse Rate 05/05/17 1341 88     Resp 05/05/17 1341 18     Temp 05/05/17 1341 98.4 F (36.9 C)     Temp Source 05/05/17 1341 Oral     SpO2 05/05/17 1341 99 %     Weight 05/05/17 1341 188 lb (85.3 kg)     Height 05/05/17 1341 4\' 10"  (1.473 m)     Head Circumference --  Peak Flow --      Pain Score 05/05/17 1400 0     Pain Loc --      Pain Edu? --      Excl. in [place]? --     Constitutional: Alert and oriented. Well appearing and in no acute distress. Eyes: Conjunctivae are normal.  Head: Atraumatic. Nose: No congestion/rhinnorhea. Mouth/Throat: Mucous membranes are moist.   Neck: Normal range of motion.  Cardiovascular: Good peripheral circulation. Respiratory: Normal respiratory effort.   Gastrointestinal: Soft and nontender.  Genitourinary: No flank tenderness. Musculoskeletal:  Extremities warm and well perfused.  Neurologic:  Normal  speech and language. No gross focal neurologic deficits are appreciated.  Skin:  Skin is warm and dry. No rash noted. Psychiatric: Mood and affect are normal. Speech and behavior are normal.  ____________________________________________   LABS (all labs ordered are listed, but only abnormal results are displayed)  Labs Reviewed  HCG, QUANTITATIVE, PREGNANCY - Abnormal; Notable for the following components:      Result Value   hCG, Beta Chain, Quant, S 131,647 (*)    All other components within normal limits  URINALYSIS, COMPLETE (UACMP) WITH MICROSCOPIC - Abnormal; Notable for the following components:   Color, Urine YELLOW (*)    APPearance CLOUDY (*)    Bacteria, UA FEW (*)    Squamous Epithelial / LPF 0-5 (*)    All other components within normal limits  POCT PREGNANCY, URINE - Abnormal; Notable for the following components:   Preg Test, Ur POSITIVE (*)    All other components within normal limits   ____________________________________________  EKG   ____________________________________________  RADIOLOGY  US ob: [redacted] weeks gestation with normal FHR  ____________________________________________   PROCEDURES  Procedure(s) performed: No  Procedures  Critical Care performed: No ____________________________________________   INITIAL IMPRESSION / ASSESSMENT AND PLAN / ED COURSE  Pertinent labs & imaging results that were available during my care of the patient were reviewed by me and considered in my medical decision making (see chart for details).  32 year old female G3 P1 at 13 weeks by dates presents with decreased fetal movement for last week, and specifically presents today because she was scheduled to have her first ultrasound at the health department but the appointment was canceled.  Patient denies any other acute symptoms.  Given the lack of bleeding or pain, I have a low suspicion for miscarriage, however given that the patient is scheduled for an  ultrasound anyway and this is an acute change in her symptoms, we will obtain an ultrasound to evaluate for IUP and FHR.  Will also obtain a UA.    ----------------------------------------- 5:20 PM on 05/05/2017 -----------------------------------------  Ultrasound shows no concerning acute findings.  UA is consistent with possible UTI, which we will treat.  The patient feels well to go home.  Return precautions given, and she expresses understanding.  ____________________________________________   FINAL CLINICAL IMPRESSION(S) / ED DIAGNOSES  Final diagnoses:  [redacted] weeks gestation of pregnancy  Urinary tract infection without hematuria, site unspecified      NEW MEDICATIONS STARTED DURING THIS VISIT:  New Prescriptions   NITROFURANTOIN, MACROCRYSTAL-MONOHYDRATE, (MACROBID) 100 MG CAPSULE    Take 1 capsule (100 mg total) by mouth 2 (two) times daily for 7 days.     Note:  This document was prepared using Dragon voice recognition software and may include unintentional dictation errors.     Arta Silence, MD 05/05/17 [phone]

## 2017-05-05 NOTE — ED Notes (Signed)
Attempted to ascultate fht with doppler without success.

## 2017-05-05 NOTE — Discharge Instructions (Signed)
Your ultrasound shows a pregnancy at 13 weeks with normal fetal heartbeat.  You also likely have a mild urinary tract infection.  Take the antibiotic as prescribed and finish the full course.  Follow-up at the health department when they are able to reschedule you.

## 2020-03-27 ENCOUNTER — Other Ambulatory Visit: Payer: Medicaid Other

## 2020-03-30 ENCOUNTER — Other Ambulatory Visit: Payer: Self-pay

## 2020-03-30 ENCOUNTER — Ambulatory Visit (LOCAL_COMMUNITY_HEALTH_CENTER): Payer: Medicaid Other

## 2020-03-30 DIAGNOSIS — Z111 Encounter for screening for respiratory tuberculosis: Secondary | ICD-10-CM

## 2020-04-02 ENCOUNTER — Ambulatory Visit (LOCAL_COMMUNITY_HEALTH_CENTER): Payer: Medicaid Other

## 2020-04-02 ENCOUNTER — Other Ambulatory Visit: Payer: Self-pay

## 2020-04-02 DIAGNOSIS — Z111 Encounter for screening for respiratory tuberculosis: Secondary | ICD-10-CM

## 2020-12-19 HISTORY — PX: LAPAROSCOPIC GASTRIC BYPASS: SUR771

## 2022-03-10 DIAGNOSIS — Z803 Family history of malignant neoplasm of breast: Secondary | ICD-10-CM | POA: Diagnosis not present

## 2022-03-10 DIAGNOSIS — D171 Benign lipomatous neoplasm of skin and subcutaneous tissue of trunk: Secondary | ICD-10-CM | POA: Diagnosis not present

## 2022-03-10 DIAGNOSIS — N6321 Unspecified lump in the left breast, upper outer quadrant: Secondary | ICD-10-CM | POA: Diagnosis not present

## 2022-04-01 DIAGNOSIS — E119 Type 2 diabetes mellitus without complications: Secondary | ICD-10-CM | POA: Diagnosis not present

## 2022-04-01 DIAGNOSIS — E039 Hypothyroidism, unspecified: Secondary | ICD-10-CM | POA: Diagnosis not present

## 2022-04-01 DIAGNOSIS — Z1389 Encounter for screening for other disorder: Secondary | ICD-10-CM | POA: Diagnosis not present

## 2022-04-01 DIAGNOSIS — Z113 Encounter for screening for infections with a predominantly sexual mode of transmission: Secondary | ICD-10-CM | POA: Diagnosis not present

## 2022-04-01 DIAGNOSIS — Z124 Encounter for screening for malignant neoplasm of cervix: Secondary | ICD-10-CM | POA: Diagnosis not present

## 2022-04-01 DIAGNOSIS — N898 Other specified noninflammatory disorders of vagina: Secondary | ICD-10-CM | POA: Diagnosis not present

## 2022-04-01 DIAGNOSIS — Z1159 Encounter for screening for other viral diseases: Secondary | ICD-10-CM | POA: Diagnosis not present

## 2022-04-01 DIAGNOSIS — N8110 Cystocele, unspecified: Secondary | ICD-10-CM | POA: Diagnosis not present

## 2022-04-01 DIAGNOSIS — Z13220 Encounter for screening for lipoid disorders: Secondary | ICD-10-CM | POA: Diagnosis not present

## 2022-04-01 DIAGNOSIS — E611 Iron deficiency: Secondary | ICD-10-CM | POA: Diagnosis not present

## 2022-04-01 DIAGNOSIS — Z131 Encounter for screening for diabetes mellitus: Secondary | ICD-10-CM | POA: Diagnosis not present

## 2022-05-22 DIAGNOSIS — Z712 Person consulting for explanation of examination or test findings: Secondary | ICD-10-CM | POA: Diagnosis not present

## 2022-05-22 DIAGNOSIS — A5901 Trichomonal vulvovaginitis: Secondary | ICD-10-CM | POA: Diagnosis not present

## 2022-05-22 DIAGNOSIS — Z113 Encounter for screening for infections with a predominantly sexual mode of transmission: Secondary | ICD-10-CM | POA: Diagnosis not present

## 2022-05-22 DIAGNOSIS — Z1389 Encounter for screening for other disorder: Secondary | ICD-10-CM | POA: Diagnosis not present

## 2022-06-28 ENCOUNTER — Emergency Department
Admission: EM | Admit: 2022-06-28 | Discharge: 2022-06-28 | Disposition: A | Discharge status: Home / Self Care | Payer: No Typology Code available for payment source | Attending: Emergency Medicine | Admitting: Emergency Medicine

## 2022-06-28 ENCOUNTER — Other Ambulatory Visit: Payer: Self-pay

## 2022-06-28 DIAGNOSIS — K649 Unspecified hemorrhoids: Secondary | ICD-10-CM | POA: Diagnosis not present

## 2022-06-28 MED ORDER — HYDROCORTISONE ACETATE 25 MG RE SUPP: 25.0000 mg | Freq: Two times a day (BID) | RECTAL | 1 refills | Status: DC

## 2022-06-28 MED ORDER — LIDOCAINE 0.5 % EX GEL: 1.0000 | Freq: Three times a day (TID) | CUTANEOUS | 1 refills | Status: DC | PRN

## 2022-06-28 NOTE — ED Provider Notes (Signed)
[facility] Provider Note  Patient Contact: 9:08 PM (approximate)   History   Hemorrhoids (X3 days)   HPI  Ta Michelle Holland is a 37 y.o. female who presents the emergency department complaining of a bleeding hemorrhoid.  Patient states that she has a history of a hemorrhoid that has caused problems intermittently.  Patient states that she had a GI illness, had been straining with diarrhea and worsening her hemorrhoid.  Patient states that the GI symptoms have improved but she is here primarily for her hemorrhoid.  She has not tried any medications other than Preparation H wipes.  She states that the symptoms have improved somewhat but has not fully resolved.  She has never had hemorrhoids banded or surgically removed.     Physical Exam   Triage Vital Signs: ED Triage Vitals  Enc Vitals Group     BP 06/28/22 1846 121/79     Pulse Rate 06/28/22 1846 90     Resp 06/28/22 1846 16     Temp 06/28/22 1846 98.3 F (36.8 C)     Temp Source 06/28/22 1846 Oral     SpO2 06/28/22 1846 98 %     Weight 06/28/22 1847 165 lb (74.8 kg)     Height 06/28/22 1847 4\' 10"  (1.473 m)     Head Circumference --      Peak Flow --      Pain Score 06/28/22 1847 5     Pain Loc --      Pain Edu? --      Excl. in [place]? --     Most recent vital signs: Vitals:   06/28/22 1846  BP: 121/79  Pulse: 90  Resp: 16  Temp: 98.3 F (36.8 C)  SpO2: 98%     General: Alert and in no acute distress.  Cardiovascular:  Good peripheral perfusion Respiratory: Normal respiratory effort without tachypnea or retractions. Lungs CTAB.  Gastrointestinal: Bowel sounds 4 quadrants. Soft and nontender to palpation. No guarding or rigidity. No palpable masses. No distention. No CVA tenderness. Musculoskeletal: Full range of motion to all extremities.  Neurologic:  No gross focal neurologic deficits are appreciated.  Skin:   No rash noted Other:   ED Results / Procedures / Treatments    Labs (all labs ordered are listed, but only abnormal results are displayed) Labs Reviewed - No data to display   EKG     RADIOLOGY   No results found.  PROCEDURES:  Critical Care performed: No  Procedures   MEDICATIONS ORDERED IN ED: Medications - No data to display   IMPRESSION / MDM / ASSESSMENT AND PLAN / ED COURSE  I reviewed the triage vital signs and the nursing notes.                                 Differential diagnosis includes, but is not limited to, hemorrhoids, fissure, GI bleeding  Patient's presentation is most consistent with acute presentation with potential threat to life or bodily function.   Patient's diagnosis is consistent with hemorrhoids.  Patient presents emergency department complaining of a painful hemorrhoid.  She has had these in the past and typically resolves with over-the-counter medications.  She has been using a sitz bath and Preparation H which has improved symptoms somewhat but not fully alleviated all of the symptoms of the hemorrhoid.  At this time I recommend following up with general surgery to  have this excised but in the interim we will prescribe symptom control medications for the patient.  Follow-up with general surgery as listed above to talk about surgical management of her hemorrhoids..  Patient is given ED precautions to return to the ED for any worsening or new symptoms.     FINAL CLINICAL IMPRESSION(S) / ED DIAGNOSES   Final diagnoses:  Hemorrhoids, unspecified hemorrhoid type     Rx / DC Orders   ED Discharge Orders          Ordered    hydrocortisone (ANUSOL-HC) 25 MG suppository  Every 12 hours        06/28/22 2131    Lidocaine 0.5 % GEL  3 times daily PRN        06/28/22 2131             Note:  This document was prepared using Dragon voice recognition software and may include unintentional dictation errors.   Lanette Hampshire 06/28/22 2132    Concha Se, MD 06/29/22 1121

## 2022-06-28 NOTE — ED Triage Notes (Signed)
Pt to ED from home for hemorrhoid. Pt noticed it Wednesday morning. Pt advised it is better than it was Wednesday. She has been soaking in epsom salt and using preparation H wipes. Pt is CAOx4 and in no acute distress, ambulatory in triage.

## 2022-06-28 NOTE — ED Notes (Signed)
Pt verbalizes understanding of discharge instructions. Opportunity for questioning and answers were provided. Pt discharged from ED to home.   ? ?

## 2022-07-16 ENCOUNTER — Encounter: Payer: Self-pay | Admitting: Surgery

## 2022-07-16 ENCOUNTER — Ambulatory Visit (INDEPENDENT_AMBULATORY_CARE_PROVIDER_SITE_OTHER): Payer: No Typology Code available for payment source | Admitting: Surgery

## 2022-07-16 VITALS — BP 118/84 | HR 91 | Temp 98.0°F | Ht 59.0 in | Wt 161.0 lb

## 2022-07-16 DIAGNOSIS — K644 Residual hemorrhoidal skin tags: Secondary | ICD-10-CM

## 2022-07-16 DIAGNOSIS — K648 Other hemorrhoids: Secondary | ICD-10-CM | POA: Diagnosis not present

## 2022-07-16 NOTE — H&P (View-Only) (Signed)
07/16/2022  Reason for Visit: Internal and external hemorrhoids  History of Present Illness: Michelle Holland is a 36 y.o. female presenting for evaluation of hemorrhoids.  The patient presented to emergency room on 06/28/2022 with significant pain in the perianal area.  She had a viral illness which caused diarrhea and this caused a flareup of the hemorrhoids.  She reported that she felt 2 areas that were enlarged, firm, and very tender to palpation.  She was having very difficult time even just sitting down because of the pain.  She presented to the emergency room and was given a prescription for Anusol suppository and lidocaine gel.  The patient reports that after a few days, the area improved and subsided and the pain is now resolved.  She does continue to feel 2 small enlarged areas but these are not painful anymore.  Denies any bleeding episodes during this.  She does report that in the past she has been dealing more with issues with itching and discomfort in the perianal area but no episodes of severe enlargement or pain.  However, she is ready to have these areas removed to prevent further episodes or further issues.  Past Medical History: Past Medical History:  Diagnosis Date   Cervical cancer (HCC)    GERD (gastroesophageal reflux disease)    Migraine      Past Surgical History: Past Surgical History:  Procedure Laterality Date   CESAREAN SECTION     2   DILATION AND CURETTAGE, DIAGNOSTIC / THERAPEUTIC     LAPAROSCOPIC GASTRIC BYPASS  12/19/2020    Home Medications: Prior to Admission medications   Medication Sig Start Date End Date Taking? Authorizing Provider  hydrocortisone (ANUSOL-HC) 25 MG suppository Place 1 suppository (25 mg total) rectally every 12 (twelve) hours. 06/28/22 06/28/23 Yes Cuthriell, Jonathan D, PA-C  levonorgestrel (MIRENA) 20 MCG/DAY IUD 1 each by Intrauterine route once.   Yes [provider]  Lidocaine 0.5 % GEL Apply 1 Application topically  3 (three) times daily as needed. 06/28/22  Yes Cuthriell, Jonathan D, PA-C  Multiple Vitamin (MULTIVITAMIN ADULT PO) Take by mouth.   Yes [provider]    Allergies: Allergies  Allergen Reactions   Latex    Yeast-Related Products Rash    Yeast guard suppositories     Social History:  reports that she quit smoking about 3 years ago. Her smoking use included cigarettes. She smoked an average of .25 packs per day. She has been exposed to tobacco smoke. She has never used smokeless tobacco. She reports current alcohol use. She reports that she does not use drugs.   Family History: No family history on file.  Review of Systems: Review of Systems  Constitutional:  Negative for chills and fever.  Respiratory:  Negative for shortness of breath.   Cardiovascular:  Negative for chest pain.  Gastrointestinal:  Positive for diarrhea. Negative for abdominal pain, nausea and vomiting.       Perianal pain/inflamed external hemorrhoid  Genitourinary:  Negative for dysuria.    Physical Exam BP 118/84   Pulse 91   Temp 98 F (36.7 C)   Ht 4' 11" (1.499 m)   Wt 161 lb (73 kg)   LMP 07/04/2022 (Approximate)   SpO2 97%   BMI 32.52 kg/m  CONSTITUTIONAL: No acute distress HEENT:  Normocephalic, atraumatic, extraocular motion intact. RESPIRATORY:  Lungs are clear, and breath sounds are equal bilaterally. Normal respiratory effort without pathologic use of accessory muscles. CARDIOVASCULAR: Heart is regular without   murmurs, gallops, or rubs. RECTAL: External exam reveals a mildly enlarged right anterior and left lateral external hemorrhoid opponents which are soft, noninflamed, and currently nontender.  There is no active bleeding noted.  No fissures noted.  Digital rectal exam reveals mildly enlarged internal components of both columns.  No gross bleeding. MUSCULOSKELETAL:  Normal muscle strength and tone in all four extremities.  No peripheral edema or cyanosis. SKIN: Skin turgor is  normal. There are no pathologic skin lesions.  NEUROLOGIC:  Motor and sensation is grossly normal.  Cranial nerves are grossly intact. PSYCH:  Alert and oriented to person, place and time. Affect is normal.  Laboratory Analysis: No results found for this or any previous visit (from the past 24 hour(s)).  Imaging: No results found.  Assessment and Plan: This is a 36 y.o. female with internal and external hemorrhoids.  - Discussed with patient the findings on exam showing that she does have mildly enlarged internal and external hemorrhoids of the right anterior and left lateral columns.  Overall currently there is no inflammation and the patient is no longer tender.  Discussed with her that given these findings, it is not unreasonable to proceed with surgery.  However there is no right or wrong in doing surgery or continue watchful waiting.  Discussed with her that typically this is more driven by patient's symptoms given that the surgery itself can cause a lot of pain.  The patient understands this and she would like to proceed with surgery in order to prevent further issues. - Discussed then the plan with the patient for an exam under anesthesia and hemorrhoidectomy and reviewed the surgery at length with the patient including the planned incisions, the risks of bleeding, infection, injury to surrounding structures, that this would be an outpatient procedure, postoperative activity restrictions, pain control, and she is willing to proceed. - We will schedule surgery for 08/07/2022.  All of her questions have been answered.  I spent 30 minutes dedicated to the care of this patient on the date of this encounter to include pre-visit review of records, face-to-face time with the patient discussing diagnosis and management, and any post-visit coordination of care.   Hollyanne Schloesser Luis Shenetta Schnackenberg, MD [facility]    

## 2022-07-16 NOTE — Progress Notes (Signed)
07/16/2022  Reason for Visit: Internal and external hemorrhoids  History of Present Illness: Michelle Holland is a 37 y.o. female presenting for evaluation of hemorrhoids.  The patient presented to emergency room on 06/28/2022 with significant pain in the perianal area.  She had a viral illness which caused diarrhea and this caused a flareup of the hemorrhoids.  She reported that she felt 2 areas that were enlarged, firm, and very tender to palpation.  She was having very difficult time even just sitting down because of the pain.  She presented to the emergency room and was given a prescription for Anusol suppository and lidocaine gel.  The patient reports that after a few days, the area improved and subsided and the pain is now resolved.  She does continue to feel 2 small enlarged areas but these are not painful anymore.  Denies any bleeding episodes during this.  She does report that in the past she has been dealing more with issues with itching and discomfort in the perianal area but no episodes of severe enlargement or pain.  However, she is ready to have these areas removed to prevent further episodes or further issues.  Past Medical History: Past Medical History:  Diagnosis Date   Cervical cancer (HCC)    GERD (gastroesophageal reflux disease)    Migraine      Past Surgical History: Past Surgical History:  Procedure Laterality Date   CESAREAN SECTION     2   DILATION AND CURETTAGE, DIAGNOSTIC / THERAPEUTIC     LAPAROSCOPIC GASTRIC BYPASS  12/19/2020    Home Medications: Prior to Admission medications   Medication Sig Start Date End Date Taking? Authorizing Provider  hydrocortisone (ANUSOL-HC) 25 MG suppository Place 1 suppository (25 mg total) rectally every 12 (twelve) hours. 06/28/22 06/28/23 Yes Cuthriell, Delorise Royals, PA-C  levonorgestrel (MIRENA) 20 MCG/DAY IUD 1 each by Intrauterine route once.   Yes [provider]  Lidocaine 0.5 % GEL Apply 1 Application topically  3 (three) times daily as needed. 06/28/22  Yes Cuthriell, Delorise Royals, PA-C  Multiple Vitamin (MULTIVITAMIN ADULT PO) Take by mouth.   Yes [provider]    Allergies: Allergies  Allergen Reactions   Latex    Yeast-Related Products Rash    Yeast guard suppositories     Social History:  reports that she quit smoking about 3 years ago. Her smoking use included cigarettes. She smoked an average of .25 packs per day. She has been exposed to tobacco smoke. She has never used smokeless tobacco. She reports current alcohol use. She reports that she does not use drugs.   Family History: No family history on file.  Review of Systems: Review of Systems  Constitutional:  Negative for chills and fever.  Respiratory:  Negative for shortness of breath.   Cardiovascular:  Negative for chest pain.  Gastrointestinal:  Positive for diarrhea. Negative for abdominal pain, nausea and vomiting.       Perianal pain/inflamed external hemorrhoid  Genitourinary:  Negative for dysuria.    Physical Exam BP 118/84   Pulse 91   Temp 98 F (36.7 C)   Ht 4\' 11"  (1.499 m)   Wt 161 lb (73 kg)   LMP 07/04/2022 (Approximate)   SpO2 97%   BMI 32.52 kg/m  CONSTITUTIONAL: No acute distress HEENT:  Normocephalic, atraumatic, extraocular motion intact. RESPIRATORY:  Lungs are clear, and breath sounds are equal bilaterally. Normal respiratory effort without pathologic use of accessory muscles. CARDIOVASCULAR: Heart is regular without  murmurs, gallops, or rubs. RECTAL: External exam reveals a mildly enlarged right anterior and left lateral external hemorrhoid opponents which are soft, noninflamed, and currently nontender.  There is no active bleeding noted.  No fissures noted.  Digital rectal exam reveals mildly enlarged internal components of both columns.  No gross bleeding. MUSCULOSKELETAL:  Normal muscle strength and tone in all four extremities.  No peripheral edema or cyanosis. SKIN: Skin turgor is  normal. There are no pathologic skin lesions.  NEUROLOGIC:  Motor and sensation is grossly normal.  Cranial nerves are grossly intact. PSYCH:  Alert and oriented to person, place and time. Affect is normal.  Laboratory Analysis: No results found for this or any previous visit (from the past 24 hour(s)).  Imaging: No results found.  Assessment and Plan: This is a 37 y.o. female with internal and external hemorrhoids.  - Discussed with patient the findings on exam showing that she does have mildly enlarged internal and external hemorrhoids of the right anterior and left lateral columns.  Overall currently there is no inflammation and the patient is no longer tender.  Discussed with her that given these findings, it is not unreasonable to proceed with surgery.  However there is no right or wrong in doing surgery or continue watchful waiting.  Discussed with her that typically this is more driven by patient's symptoms given that the surgery itself can cause a lot of pain.  The patient understands this and she would like to proceed with surgery in order to prevent further issues. - Discussed then the plan with the patient for an exam under anesthesia and hemorrhoidectomy and reviewed the surgery at length with the patient including the planned incisions, the risks of bleeding, infection, injury to surrounding structures, that this would be an outpatient procedure, postoperative activity restrictions, pain control, and she is willing to proceed. - We will schedule surgery for 08/07/2022.  All of her questions have been answered.  I spent 30 minutes dedicated to the care of this patient on the date of this encounter to include pre-visit review of records, face-to-face time with the patient discussing diagnosis and management, and any post-visit coordination of care.   Howie Ill, MD [facility]

## 2022-07-16 NOTE — Patient Instructions (Addendum)
Advised to pursue a goal of 25 to 30 g of fiber daily.  Made aware that the majority of this may be through natural sources, but advised to be aware of actual consumption and to ensure minimal consumption by daily supplementation.  Various forms of supplements discussed.  Recommended Psyllium husk(Metamucil), that mixes well with applesauce, or the powder.  Strongly advised to consume more fluids to ensure adequate hydration, instructed to watch color of urine to determine adequacy of hydration.  Clarity is pursued in urine output, and bowel activity that correlates to significant meal intake.   We need to avoid deferring having bowel movements, advised to take the time at the first sign of sensation, typically following meals, and in the morning.   Subsequent utilization of MiraLAX may be needed ensure at least daily movement, ideally twice daily bowel movements.  If multiple doses of MiraLAX are necessary utilize them. Never skip a day...  To be regular, we must do the above EVERY day.    Hemorrhoidectomy You have requested to have Hemorrhoid surgery today. This will be scheduled at [facility] with Dr Aleen Campi.  Please review the information below and your [facility] Information. Our surgery scheduler will call you to review surgery date and to go over information.  If you have FMLA or disability paperwork that needs filled out you may drop this off at our office or this can be faxed to [phone].  You will be required to do 2 enemas prior to your surgery. The first will be the night prior and the second will be done the morning of surgery.  Constipation is going to be your biggest obstacle following surgery. Use all stool softeners and laxatives as prescribed after your surgery and be sure to drink 72 ounces of water or more every day. This will help to avoid constipation. If you do all of this and you are still having difficulty, please call our office for further instructions as soon as you  begin to have difficulty with bowel movements.  You may want to buy a disposable Sitz bath prior to surgery to aid in pain and cleanliness after surgery. The information on how to do use a disposable sitz bath or using a bath tub are below.  You will be out of work 1-2 weeks depending on how your healing goes. If you have FMLA/Disability paperwork that needs filled out you may drop this off at the office or fax it to [phone].   Hemorrhoid Surgery After Care Refer to this sheet in the next few weeks. These instructions provide you with information about caring for yourself after your procedure. Your health care provider may also give you more specific instructions. Your treatment has been planned according to current medical practices, but problems sometimes occur. Call your health care provider if you have any problems or questions after your procedure. What can I expect after the procedure? After the procedure, it is common to have: Rectal pain. Pain when you are having a bowel movement. Slight rectal bleeding.  Follow these instructions at home: Medicines Take over-the-counter and prescription medicines only as told by your health care provider. Do not drive or operate heavy machinery while taking prescription pain medicine. Use a stool softener or a bulk laxative as told by your health care provider. Activity Rest at home. Return to your normal activities as told by your health care provider. Do not lift anything that is heavier than 10 lb (4.5 kg). Do not sit for long periods of  time. Take a walk every day or as told by your health care provider. Do not strain to have a bowel movement. Do not spend a long time sitting on the toilet. Eating and drinking Eat foods that contain fiber, such as whole grains, beans, nuts, fruits, and vegetables. Drink enough fluid to keep your urine clear or pale yellow. General instructions Sit in a warm bath 2-3 times per day to relieve soreness or  itching. Keep all follow-up visits as told by your health care provider. This is important. Contact a health care provider if: Your pain medicine is not helping. You have a fever or chills. You become constipated. You have trouble passing urine. Get help right away if: You have very bad rectal pain. You have heavy bleeding from your rectum. This information is not intended to replace advice given to you by your health care provider. Make sure you discuss any questions you have with your health care provider. Document Released: 04/19/2003 Document Revised: 07/05/2015 Document Reviewed: 04/24/2014 Elsevier Interactive Patient Education  2018 [employer].   Disposable Sitz Bath A disposable sitz bath is a plastic basin that fits over the toilet. A bag is hung above the toilet, and the bag is connected to a tube that opens into the basin. The bag is filled with warm water that flows into the basin through the tube. A sitz bath can be used to help relieve symptoms, clean, and promote healing in the genital and anal areas, as well as in the lower abdomen and buttocks. What are the risks? Sitz baths are generally very safe. It is possible for the skin between the genitals and the anus (perineum) to become infected, but this is rare. You can avoid this by cleaning your sitz bath supplies thoroughly. How to use a disposable sitz bath Close the clamp on the tube. Make sure the clamp is closed tightly to prevent leakage. Fill the sitz bath basin and the plastic bag with warm water. The water should be warm enough to be comfortable, but not hot. Raise the toilet seat and place the filled basin on the toilet. Make sure the overflow opening is facing toward the back of the toilet. If you prefer, you may place the empty basin on the toilet first, and then use the plastic bag to fill the basin with warm water. Hang the filled plastic bag overhead on a hook or towel rack close to the toilet. The bag should  be higher than the toilet so that the water will flow down through the tube. Attach the tube to the opening on the basin. Make sure that the tube is attached to the basin tightly to prevent leakage. Sit on the basin and release the clamp. This will allow warm water to flow into the basin and flush the area around your genitals and anus. Remain sitting on the basin for about 15-20 minutes, or as long as told by your health care provider. Stand up and gently pat your skin dry. If directed, apply clean bandages (dressings) to the affected area as told by your health care provider. Carefully remove the basin from the toilet seat and tip the basin into the toilet to empty any remaining water. Empty any remaining water from the plastic bag into the toilet. Then, flush the toilet. Wash the basin with warm water and soap. Let the basin air dry in the sink. You should also let the plastic bag and the tubing air dry. Store the basin, tubing, and plastic  bag in a clean, dry area. Wash your hands with soap and water. If soap and water are not available, use hand sanitizer. Contact a health care provider if: You have symptoms that get worse instead of better. You develop new skin irritation, redness, or swelling around your genitals or anus. This information is not intended to replace advice given to you by your health care provider. Make sure you discuss any questions you have with your health care provider. Document Released: 07/29/2011 Document Revised: 07/05/2015 Document Reviewed: 12/17/2014 Elsevier Interactive Patient Education  2018 [employer].   How to Take a [employer] A sitz bath is a warm water bath that is taken while you are sitting down. The water should only come up to your hips and should cover your buttocks. Your health care provider may recommend a sitz bath to help you: Clean the lower part of your body, including your genital area. With itching. With pain. With sore muscles or muscles  that tighten or spasm.  How to take a sitz bath Take 3-4 sitz baths per day or as told by your health care provider. Partially fill a bathtub with warm water. You will only need the water to be deep enough to cover your hips and buttocks when you are sitting in it. If your health care provider told you to put medicine in the water, follow the directions exactly. Sit in the water and open the tub drain a little. Turn on the warm water again to keep the tub at the correct level. Keep the water running constantly. Soak in the water for 15-20 minutes or as told by your health care provider. After the sitz bath, pat the affected area dry first. Do not rub it. Be careful when you stand up after the sitz bath because you may feel dizzy.  Contact a health care provider if: Your symptoms get worse. Do not continue with sitz baths if your symptoms get worse. You have new symptoms. Do not continue with sitz baths until you talk with your health care provider. This information is not intended to replace advice given to you by your health care provider. Make sure you discuss any questions you have with your health care provider. Document Released: 10/20/2003 Document Revised: 06/27/2015 Document Reviewed: 01/25/2014 Elsevier Interactive Patient Education  [employer].

## 2022-07-17 ENCOUNTER — Telehealth: Payer: Self-pay | Admitting: Surgery

## 2022-07-17 NOTE — Telephone Encounter (Signed)
Patient has been advised of Pre-Admission date/time, and Surgery date at [facility].  Surgery Date: 08/07/22 Preadmission Testing Date: 07/30/22 (phone 1p-4p)  Patient has been made aware to call [phone], between 1-3:00pm the day before surgery, to find out what time to arrive for surgery.

## 2022-07-30 ENCOUNTER — Encounter
Admission: RE | Admit: 2022-07-30 | Discharge: 2022-07-30 | Disposition: A | Discharge status: Home / Self Care | Payer: BLUE CROSS/BLUE SHIELD | Source: Ambulatory Visit | Attending: Surgery | Admitting: Surgery

## 2022-07-30 ENCOUNTER — Other Ambulatory Visit: Payer: Self-pay

## 2022-07-30 DIAGNOSIS — Z01812 Encounter for preprocedural laboratory examination: Secondary | ICD-10-CM

## 2022-07-30 HISTORY — DX: Anxiety disorder, unspecified: F41.9

## 2022-07-30 HISTORY — DX: Anemia, unspecified: D64.9

## 2022-07-30 HISTORY — DX: Polycystic ovarian syndrome: E28.2

## 2022-07-30 HISTORY — DX: Sleep apnea, unspecified: G47.30

## 2022-07-30 HISTORY — DX: Type 2 diabetes mellitus without complications: E11.9

## 2022-07-30 HISTORY — DX: Other complications of anesthesia, initial encounter: T88.59XA

## 2022-07-30 HISTORY — DX: Depression, unspecified: F32.A

## 2022-07-30 HISTORY — DX: Post-traumatic stress disorder, unspecified: F43.10

## 2022-07-30 NOTE — Patient Instructions (Addendum)
Your procedure is scheduled on: 08/07/22 - Thursday Report to the Registration Desk on the 1st floor of the [facility]. To find out your arrival time, please call [phone] between 1PM - 3PM on: 08/06/22 - Wednesday If your arrival time is 6:00 am, do not arrive before that time as the [facility] entrance doors do not open until 6:00 am.  REMEMBER: Instructions that are not followed completely may result in serious medical risk, up to and including death; or upon the discretion of your surgeon and anesthesiologist your surgery may need to be rescheduled.  Do not eat food after midnight the night before surgery.  No gum chewing or hard candies.  You may however, drink CLEAR liquids up to 2 hours before you are scheduled to arrive for your surgery. Do not drink anything within 2 hours of your scheduled arrival time.  Clear liquids include: - water  - apple juice without pulp - gatorade (not RED colors) - black coffee or tea (Do NOT add milk or creamers to the coffee or tea) Do NOT drink anything that is not on this list.   One week prior to surgery: Stop Anti-inflammatories (NSAIDS) such as Advil, Aleve, Ibuprofen, Motrin, Naproxen, Naprosyn and Aspirin based products such as Excedrin, Goody's Powder, BC Powder.  Stop ANY OVER THE COUNTER supplements until after surgery.  You may however, continue to take Tylenol if needed for pain up until the day of surgery.   TAKE ONLY THESE MEDICATIONS THE MORNING OF SURGERY WITH A SIP OF WATER:   - omeprazole (PRILOSEC) take 1 on the night before your surgery and another one on the morning of.   USE 1 Fleets enema  as directed on the morning of your surgery , 2 hours before you are scheduled to arrive .  No Alcohol for 24 hours before or after surgery.  No Smoking including e-cigarettes for 24 hours before surgery.  No chewable tobacco products for at least 6 hours before surgery.  No nicotine patches on the day of surgery.  Do  not use any "recreational" drugs for at least a week (preferably 2 weeks) before your surgery.  Please be advised that the combination of cocaine and anesthesia may have negative outcomes, up to and including death. If you test positive for cocaine, your surgery will be cancelled.  On the morning of surgery brush your teeth with toothpaste and water, you may rinse your mouth with mouthwash if you wish. Do not swallow any toothpaste or mouthwash.  Do not wear jewelry, make-up, hairpins, clips or nail polish.  Do not wear lotions, powders, or perfumes.   Do not shave body hair from the neck down 48 hours before surgery.  Contact lenses, hearing aids and dentures may not be worn into surgery.  Do not bring valuables to the hospital. [facility] is not responsible for any missing/lost belongings or valuables.   Notify your doctor if there is any change in your medical condition (cold, fever, infection).  Wear comfortable clothing (specific to your surgery type) to the hospital.  After surgery, you can help prevent lung complications by doing breathing exercises.  Take deep breaths and cough every 1-2 hours. Your doctor may order a device called an Incentive Spirometer to help you take deep breaths. When coughing or sneezing, hold a pillow firmly against your incision with both hands. This is called "splinting." Doing this helps protect your incision. It also decreases belly discomfort.  If you are being admitted to the  hospital overnight, leave your suitcase in the car. After surgery it may be brought to your room.  In case of increased patient census, it may be necessary for you, the patient, to continue your postoperative care in the Same Day Surgery department.  If you are being discharged the day of surgery, you will not be allowed to drive home. You will need a responsible individual to drive you home and stay with you for 24 hours after surgery.   If you are taking public  transportation, you will need to have a responsible individual with you.  Please call the Pre-admissions Testing Dept. at [phone] if you have any questions about these instructions.  Surgery Visitation Policy:  Patients having surgery or a procedure may have two visitors.  Children under the age of 73 must have an adult with them who is not the patient.  Inpatient Visitation:    Visiting hours are 7 a.m. to 8 p.m. Up to four visitors are allowed at one time in a patient room. The visitors may rotate out with other people during the day.  One visitor age 73 or older may stay with the patient overnight and must be in the room by 8 p.m.

## 2022-08-07 ENCOUNTER — Other Ambulatory Visit: Payer: Self-pay

## 2022-08-07 ENCOUNTER — Ambulatory Visit: Payer: No Typology Code available for payment source | Admitting: Urgent Care

## 2022-08-07 ENCOUNTER — Ambulatory Visit
Admission: RE | Admit: 2022-08-07 | Discharge: 2022-08-07 | Disposition: A | Discharge status: Home / Self Care | Payer: No Typology Code available for payment source | Attending: Surgery | Admitting: Surgery

## 2022-08-07 ENCOUNTER — Ambulatory Visit: Payer: No Typology Code available for payment source | Admitting: Anesthesiology

## 2022-08-07 ENCOUNTER — Encounter
Admission: RE | Disposition: A | Discharge status: Home / Self Care | Payer: Self-pay | Source: Home / Self Care | Attending: Surgery

## 2022-08-07 ENCOUNTER — Encounter: Payer: Self-pay | Admitting: Surgery

## 2022-08-07 DIAGNOSIS — K648 Other hemorrhoids: Secondary | ICD-10-CM | POA: Insufficient documentation

## 2022-08-07 DIAGNOSIS — K644 Residual hemorrhoidal skin tags: Secondary | ICD-10-CM | POA: Diagnosis not present

## 2022-08-07 DIAGNOSIS — Z08 Encounter for follow-up examination after completed treatment for malignant neoplasm: Secondary | ICD-10-CM | POA: Insufficient documentation

## 2022-08-07 DIAGNOSIS — Z8541 Personal history of malignant neoplasm of cervix uteri: Secondary | ICD-10-CM | POA: Diagnosis not present

## 2022-08-07 DIAGNOSIS — Z9884 Bariatric surgery status: Secondary | ICD-10-CM | POA: Insufficient documentation

## 2022-08-07 DIAGNOSIS — Z87891 Personal history of nicotine dependence: Secondary | ICD-10-CM | POA: Diagnosis not present

## 2022-08-07 DIAGNOSIS — Z79899 Other long term (current) drug therapy: Secondary | ICD-10-CM | POA: Diagnosis not present

## 2022-08-07 DIAGNOSIS — Z01812 Encounter for preprocedural laboratory examination: Secondary | ICD-10-CM

## 2022-08-07 DIAGNOSIS — Z09 Encounter for follow-up examination after completed treatment for conditions other than malignant neoplasm: Secondary | ICD-10-CM | POA: Diagnosis not present

## 2022-08-07 DIAGNOSIS — K219 Gastro-esophageal reflux disease without esophagitis: Secondary | ICD-10-CM | POA: Insufficient documentation

## 2022-08-07 HISTORY — PX: EVALUATION UNDER ANESTHESIA WITH HEMORRHOIDECTOMY: SHX5624

## 2022-08-07 LAB — POCT PREGNANCY, URINE: Preg Test, Ur: NEGATIVE

## 2022-08-07 SURGERY — EXAM UNDER ANESTHESIA WITH HEMORRHOIDECTOMY
Anesthesia: General

## 2022-08-07 MED ORDER — CHLORHEXIDINE GLUCONATE CLOTH 2 % EX PADS: 6.0000 | MEDICATED_PAD | Freq: Once | CUTANEOUS | Status: DC

## 2022-08-07 MED ORDER — LACTATED RINGERS IV SOLN: INTRAVENOUS | Status: DC

## 2022-08-07 MED ORDER — GABAPENTIN 300 MG PO CAPS
300.0000 mg | ORAL_CAPSULE | ORAL | Status: AC
  Administered 2022-08-07: 300 mg via ORAL

## 2022-08-07 MED ORDER — MIDAZOLAM HCL 2 MG/2ML IJ SOLN
INTRAMUSCULAR | Status: DC | PRN
  Administered 2022-08-07: 2 mg via INTRAVENOUS

## 2022-08-07 MED ORDER — GELATIN ABSORBABLE 100 EX MISC
CUTANEOUS | Status: DC | PRN
  Administered 2022-08-07: 1

## 2022-08-07 MED ORDER — BUPIVACAINE-EPINEPHRINE (PF) 0.5% -1:200000 IJ SOLN
INTRAMUSCULAR | Status: DC | PRN
  Administered 2022-08-07: 50 mL

## 2022-08-07 MED ORDER — PROPOFOL 10 MG/ML IV BOLUS
INTRAVENOUS | Status: AC
  Filled 2022-08-07: qty 20

## 2022-08-07 MED ORDER — ORAL CARE MOUTH RINSE: 15.0000 mL | Freq: Once | OROMUCOSAL | Status: AC

## 2022-08-07 MED ORDER — SODIUM CHLORIDE 0.9 % IV SOLN
INTRAVENOUS | Status: AC
  Filled 2022-08-07: qty 2

## 2022-08-07 MED ORDER — ACETAMINOPHEN 500 MG PO TABS: 1000.0000 mg | ORAL_TABLET | Freq: Four times a day (QID) | ORAL | Status: AC | PRN

## 2022-08-07 MED ORDER — DEXMEDETOMIDINE HCL IN NACL 80 MCG/20ML IV SOLN
INTRAVENOUS | Status: DC | PRN
  Administered 2022-08-07: 8 ug via INTRAVENOUS

## 2022-08-07 MED ORDER — OXYCODONE HCL 5 MG PO TABS
ORAL_TABLET | ORAL | Status: AC
  Filled 2022-08-07: qty 1

## 2022-08-07 MED ORDER — ACETAMINOPHEN 500 MG PO TABS
1000.0000 mg | ORAL_TABLET | ORAL | Status: AC
  Administered 2022-08-07: 1000 mg via ORAL

## 2022-08-07 MED ORDER — GABAPENTIN 300 MG PO CAPS: 300.0000 mg | ORAL_CAPSULE | Freq: Three times a day (TID) | ORAL | 0 refills | Status: AC

## 2022-08-07 MED ORDER — FLEET ENEMA 7-19 GM/118ML RE ENEM
1.0000 | ENEMA | Freq: Once | RECTAL | Status: AC
  Administered 2022-08-07: 1 via RECTAL

## 2022-08-07 MED ORDER — BUPIVACAINE HCL (PF) 0.5 % IJ SOLN
INTRAMUSCULAR | Status: AC
  Filled 2022-08-07: qty 30

## 2022-08-07 MED ORDER — DEXAMETHASONE SODIUM PHOSPHATE 10 MG/ML IJ SOLN
INTRAMUSCULAR | Status: DC | PRN
  Administered 2022-08-07: 10 mg via INTRAVENOUS

## 2022-08-07 MED ORDER — FENTANYL CITRATE (PF) 100 MCG/2ML IJ SOLN
INTRAMUSCULAR | Status: DC | PRN
  Administered 2022-08-07 (×2): 50 ug via INTRAVENOUS

## 2022-08-07 MED ORDER — PROPOFOL 10 MG/ML IV BOLUS
INTRAVENOUS | Status: DC | PRN
  Administered 2022-08-07: 200 mg via INTRAVENOUS

## 2022-08-07 MED ORDER — OXYCODONE HCL 5 MG PO TABS
5.0000 mg | ORAL_TABLET | Freq: Once | ORAL | Status: AC | PRN
  Administered 2022-08-07: 5 mg via ORAL

## 2022-08-07 MED ORDER — OXYCODONE HCL 5 MG/5ML PO SOLN: 5.0000 mg | Freq: Once | ORAL | Status: AC | PRN

## 2022-08-07 MED ORDER — PROMETHAZINE HCL 25 MG/ML IJ SOLN: 6.2500 mg | INTRAMUSCULAR | Status: DC | PRN

## 2022-08-07 MED ORDER — CHLORHEXIDINE GLUCONATE 0.12 % MT SOLN
OROMUCOSAL | Status: AC
  Filled 2022-08-07: qty 15

## 2022-08-07 MED ORDER — FENTANYL CITRATE (PF) 100 MCG/2ML IJ SOLN: 25.0000 ug | INTRAMUSCULAR | Status: DC | PRN

## 2022-08-07 MED ORDER — FENTANYL CITRATE (PF) 100 MCG/2ML IJ SOLN
INTRAMUSCULAR | Status: AC
  Filled 2022-08-07: qty 2

## 2022-08-07 MED ORDER — LIDOCAINE 5 % EX OINT: 1.0000 | TOPICAL_OINTMENT | Freq: Four times a day (QID) | CUTANEOUS | 0 refills | Status: AC | PRN

## 2022-08-07 MED ORDER — OXYCODONE HCL 5 MG PO TABS
5.0000 mg | ORAL_TABLET | ORAL | 0 refills | Status: AC | PRN
Start: 2022-08-07 — End: ?

## 2022-08-07 MED ORDER — ACETAMINOPHEN 500 MG PO TABS
ORAL_TABLET | ORAL | Status: AC
  Filled 2022-08-07: qty 2

## 2022-08-07 MED ORDER — LIDOCAINE HCL (CARDIAC) PF 100 MG/5ML IV SOSY
PREFILLED_SYRINGE | INTRAVENOUS | Status: DC | PRN
  Administered 2022-08-07: 100 mg via INTRAVENOUS

## 2022-08-07 MED ORDER — GABAPENTIN 300 MG PO CAPS
ORAL_CAPSULE | ORAL | Status: AC
  Filled 2022-08-07: qty 1

## 2022-08-07 MED ORDER — 0.9 % SODIUM CHLORIDE (POUR BTL) OPTIME
TOPICAL | Status: DC | PRN
  Administered 2022-08-07: 500 mL

## 2022-08-07 MED ORDER — MIDAZOLAM HCL 2 MG/2ML IJ SOLN
INTRAMUSCULAR | Status: AC
  Filled 2022-08-07: qty 2

## 2022-08-07 MED ORDER — GELATIN ABSORBABLE 12-7 MM EX MISC
CUTANEOUS | Status: AC
  Filled 2022-08-07: qty 1

## 2022-08-07 MED ORDER — BUPIVACAINE LIPOSOME 1.3 % IJ SUSP: 20.0000 mL | Freq: Once | INTRAMUSCULAR | Status: DC

## 2022-08-07 MED ORDER — SODIUM CHLORIDE 0.9 % IV SOLN
2.0000 g | INTRAVENOUS | Status: AC
  Administered 2022-08-07: 2 g via INTRAVENOUS

## 2022-08-07 MED ORDER — GELATIN ABSORBABLE 100 CM EX MISC
CUTANEOUS | Status: AC
  Filled 2022-08-07: qty 1

## 2022-08-07 MED ORDER — CHLORHEXIDINE GLUCONATE 0.12 % MT SOLN
15.0000 mL | Freq: Once | OROMUCOSAL | Status: AC
  Administered 2022-08-07: 15 mL via OROMUCOSAL

## 2022-08-07 MED ORDER — THROMBIN 5000 UNITS EX SOLR
CUTANEOUS | Status: AC
  Filled 2022-08-07: qty 5000

## 2022-08-07 MED ORDER — ACETAMINOPHEN 10 MG/ML IV SOLN: 1000.0000 mg | Freq: Once | INTRAVENOUS | Status: DC | PRN

## 2022-08-07 MED ORDER — BUPIVACAINE LIPOSOME 1.3 % IJ SUSP
INTRAMUSCULAR | Status: AC
  Filled 2022-08-07: qty 20

## 2022-08-07 MED ORDER — DROPERIDOL 2.5 MG/ML IJ SOLN: 0.6250 mg | Freq: Once | INTRAMUSCULAR | Status: DC | PRN

## 2022-08-07 MED ORDER — CHLORHEXIDINE GLUCONATE CLOTH 2 % EX PADS
6.0000 | MEDICATED_PAD | Freq: Once | CUTANEOUS | Status: AC
  Administered 2022-08-07: 6 via TOPICAL

## 2022-08-07 SURGICAL SUPPLY — 34 items
BRIEF MESH DISP 2XL (UNDERPADS AND DIAPERS) ×1 IMPLANT
DRAPE PERI LITHO V/GYN (MISCELLANEOUS) ×1 IMPLANT
DRAPE UNDER BUTTOCK W/FLU (DRAPES) ×1 IMPLANT
DRSG GAUZE FLUFF 36X18 (GAUZE/BANDAGES/DRESSINGS) ×1 IMPLANT
ELECT CAUTERY BLADE TIP 2.5 (TIP) ×1
ELECT REM PT RETURN 9FT ADLT (ELECTROSURGICAL) ×1
ELECTRODE CAUTERY BLDE TIP 2.5 (TIP) ×1 IMPLANT
ELECTRODE REM PT RTRN 9FT ADLT (ELECTROSURGICAL) ×1 IMPLANT
GAUZE 4X4 16PLY ~~LOC~~+RFID DBL (SPONGE) ×1 IMPLANT
GLOVE SURG SYN 7.0 (GLOVE) ×1 IMPLANT
GLOVE SURG SYN 7.0 PF PI (GLOVE) ×1 IMPLANT
GLOVE SURG SYN 7.5 E (GLOVE) ×1 IMPLANT
GLOVE SURG SYN 7.5 PF PI (GLOVE) ×1 IMPLANT
GOWN STRL REUS W/ TWL LRG LVL3 (GOWN DISPOSABLE) ×2 IMPLANT
GOWN STRL REUS W/TWL LRG LVL3 (GOWN DISPOSABLE) ×2
KIT TURNOVER KIT A (KITS) ×1 IMPLANT
LABEL OR SOLS (LABEL) ×1 IMPLANT
MANIFOLD NEPTUNE II (INSTRUMENTS) ×1 IMPLANT
NDL HYPO 22X1.5 SAFETY MO (MISCELLANEOUS) ×1 IMPLANT
NEEDLE HYPO 22X1.5 SAFETY MO (MISCELLANEOUS) ×1 IMPLANT
NS IRRIG 500ML POUR BTL (IV SOLUTION) ×1 IMPLANT
PACK BASIN MINOR ARMC (MISCELLANEOUS) ×1 IMPLANT
PAD OB MATERNITY 4.3X12.25 (PERSONAL CARE ITEMS) ×1 IMPLANT
PAD PREP OB/GYN DISP 24X41 (PERSONAL CARE ITEMS) ×1 IMPLANT
SHEARS HARMONIC 9CM CVD (BLADE) ×1 IMPLANT
SOL PREP PVP 2OZ (MISCELLANEOUS) ×1
SOLUTION PREP PVP 2OZ (MISCELLANEOUS) ×1 IMPLANT
SURGILUBE 2OZ TUBE FLIPTOP (MISCELLANEOUS) ×1 IMPLANT
SUT VIC AB 2-0 SH 27 (SUTURE)
SUT VIC AB 2-0 SH 27XBRD (SUTURE) ×2 IMPLANT
SYR 10ML LL (SYRINGE) ×1 IMPLANT
SYR BULB IRRIG 60ML STRL (SYRINGE) ×1 IMPLANT
TRAP FLUID SMOKE EVACUATOR (MISCELLANEOUS) ×1 IMPLANT
WATER STERILE IRR 500ML POUR (IV SOLUTION) ×1 IMPLANT

## 2022-08-07 NOTE — Anesthesia Preprocedure Evaluation (Addendum)
Anesthesia Evaluation  Patient identified by MRN, date of birth, ID band Patient awake    Reviewed: Allergy & Precautions, H&P , NPO status , Patient's Chart, lab work & pertinent test results  Airway Mallampati: III  TM Distance: >3 FB Neck ROM: full    Dental no notable dental hx.    Pulmonary Patient abstained from smoking., former smoker   Pulmonary exam normal        Cardiovascular negative cardio ROS Normal cardiovascular exam     Neuro/Psych  Headaches PSYCHIATRIC DISORDERS Anxiety        GI/Hepatic Neg liver ROS,GERD  Controlled and Medicated,,  Endo/Other  diabetes    Renal/GU      Musculoskeletal   Abdominal Normal abdominal exam  (+)   Peds  Hematology negative hematology ROS (+)   Anesthesia Other Findings Past Medical History: No date: Anemia     Comment:  border line No date: Anxiety No date: Cervical cancer (HCC) No date: Complication of anesthesia     Comment:  slow to wake up No date: Depression No date: Diabetes mellitus without complication (HCC)     Comment:  gestational No date: GERD (gastroesophageal reflux disease) No date: Migraine No date: PCOS (polycystic ovarian syndrome) No date: PTSD (post-traumatic stress disorder) No date: Sleep apnea     Comment:  mild  Past Surgical History: No date: CERVICAL POLYPECTOMY     Comment:  patient reports but uncertain No date: CESAREAN SECTION     Comment:  2 No date: DILATION AND CURETTAGE, DIAGNOSTIC / THERAPEUTIC 12/19/2020: LAPAROSCOPIC GASTRIC BYPASS No date: WISDOM TOOTH EXTRACTION     Reproductive/Obstetrics negative OB ROS                             Anesthesia Physical Anesthesia Plan  ASA: 2  Anesthesia Plan: General LMA   Post-op Pain Management: Tylenol PO (pre-op)* and Gabapentin PO (pre-op)*   Induction: Intravenous  PONV Risk Score and Plan: 3 and Dexamethasone, Ondansetron and  Midazolam  Airway Management Planned: LMA  Additional Equipment:   Intra-op Plan:   Post-operative Plan: Extubation in OR  Informed Consent: I have reviewed the patients History and Physical, chart, labs and discussed the procedure including the risks, benefits and alternatives for the proposed anesthesia with the patient or authorized representative who has indicated his/her understanding and acceptance.     Dental Advisory Given  Plan Discussed with: Anesthesiologist, CRNA and Surgeon  Anesthesia Plan Comments:         Anesthesia Quick Evaluation

## 2022-08-07 NOTE — Discharge Instructions (Addendum)
Discharge Instructions: 1. Patient may shower, do not scrub wounds heavily 2. May do Sitz baths twice daily and/or after bowel movements to help soothe the raw tissue and cleanse more easily.  If you do not have a tub, get in the shower and aim the shower head to the perianal area to help cleanse. 3. It is normal for there to be some bleeding/oozing.  This will improve on its own as the tissue heals. 4. There is a dissolvable gauze inside the anal canal.  You may pass this when passing gas or having bowel movement.  No need to replace once it's out. 5. May start using Lidocaine ointment on 08/10/22. 6. May apply fluffed gauze to the perianal area to help with any drainage or padding/comfort. 7. Do not drive while taking narcotics for pain control.  Prior to driving, make sure you are able to rotate right and left to look at blindspots without significant pain or discomfort. 8. Avoid strenuous activity for two weeks     .AMBULATORY SURGERY  DISCHARGE INSTRUCTIONS   The drugs that you were given will stay in your system until tomorrow so for the next 24 hours you should not:  Drive an automobile Make any legal decisions Drink any alcoholic beverage   You may resume regular meals tomorrow.  Today it is better to start with liquids and gradually work up to solid foods.  You may eat anything you prefer, but it is better to start with liquids, then soup and crackers, and gradually work up to solid foods.   Please notify your doctor immediately if you have any unusual bleeding, trouble breathing, redness and pain at the surgery site, drainage, fever, or pain not relieved by medication.     Information for Discharge Teaching:  DO NOT REMOVE TEAL BRACELET FOR 4 Days, (96 hours) 08/11/2022 EXPAREL (bupivacaine liposome injectable suspension)   Your surgeon or anesthesiologist gave you EXPAREL(bupivacaine) to help control your pain after surgery.  EXPAREL is a local anesthetic that provides  pain relief by numbing the tissue around the surgical site. EXPAREL is designed to release pain medication over time and can control pain for up to 72 hours. Depending on how you respond to EXPAREL, you may require less pain medication during your recovery.  Possible side effects: Temporary loss of sensation or ability to move in the area where bupivacaine was injected. Nausea, vomiting, constipation Rarely, numbness and tingling in your mouth or lips, lightheadedness, or anxiety may occur. Call your doctor right away if you think you may be experiencing any of these sensations, or if you have other questions regarding possible side effects.  Follow all other discharge instructions given to you by your surgeon or nurse. Eat a healthy diet and drink plenty of water or other fluids.  If you return to the hospital for any reason within 96 hours following the administration of EXPAREL, it is important for health care providers to know that you have received this anesthetic. A teal colored band has been placed on your arm with the date, time and amount of EXPAREL you have received in order to alert and inform your health care providers. Please leave this armband in place for the full 96 hours following administration, and then you may remove the band.

## 2022-08-07 NOTE — Op Note (Signed)
Procedure Date:  08/07/2022  Pre-operative Diagnosis:  Enlarged internal and external hemorrhoids  Post-operative Diagnosis: Enlarged internal and external hemorrhoids, 3 columns  Procedure:  Exam under Anesthesia, Hemorrhoidectomy of 3 columns  Surgeon:  Howie Ill, MD  Anesthesia:  General endotracheal  Estimated Blood Loss:  15 ml  Specimens: Anterior hemorrhoid Posterior hemorrhoid Lateral hemorrhoid  Complications:  None  Indications for Procedure:  This is a 37 y.o. female with a history of enlarged internal and external hemorrhoids, presenting for hemorrhoidectomy.  The risks of bleeding, infection, bowel injury, and need for further procedures were all discussed with the patient and she was willing to proceed.   Description of Procedure: The patient was correctly identified in the preoperative area and brought into the operating room.  The patient was placed supine with VTE prophylaxis in place.  Appropriate time-outs were performed.  Anesthesia was induced and the patient was intubated.  Appropriate antibiotics were infused.  The patient was then placed in high lithotomy position.   The perianal area was prepped and draped in usual sterile fashion.  The sphincter was digitally dilated.  Then the anoscope was inserted and the anal canal was evaluated, revealing enlarged internal and external hemorrhoids of all 3 columns.  We started with the anterior column.  The bivalve retractor was then inserted, and the anterior column was taken down using combination of cautery and Harmonic.  Any bleeding was controlled with Harmonic.  Then, the posterior column was taken down similarly using combination of cautery and Harmonic.  After that, the anal canal was evaluated again and there was no significant narrowing, but the lateral column still appeared enlarged, so it was decided to take it as well.  Cautery and Harmonic were used for this.  After, the anal canal was irrigated.  Any  further oozing was controlled with cautery.  50 ml of Exparel solution was infiltrated into the perianal area and as bilateral pudendal blocks.  A large gelfoam gauze was rolled and inserted into the anal canal for further hemostasis.  The perianal area was cleaned and dressed with fluffed gauze and mesh underwear.  The patient was emerged from anesthesia and extubated and brought to the recovery room for further management.  The patient tolerated the procedure well and all counts were correct at the end of the case.   Howie Ill, MD

## 2022-08-07 NOTE — Transfer of Care (Signed)
Immediate Anesthesia Transfer of Care Note  Patient: Michelle Holland  Procedure(s) Performed: EXAM UNDER ANESTHESIA WITH HEMORRHOIDECTOMY, 2 columns  Patient Location: PACU  Anesthesia Type:General  Level of Consciousness: sedated  Airway & Oxygen Therapy: Patient Spontanous Breathing and Patient connected to face mask  Post-op Assessment: Report given to RN  Post vital signs: Reviewed and stable  Last Vitals:  Vitals Value Taken Time  BP 119/73 08/07/22 1445  Temp    Pulse 91 08/07/22 1446  Resp 20 08/07/22 1446  SpO2 98 % 08/07/22 1446  Vitals shown include unvalidated device data.  Last Pain:  Vitals:   08/07/22 1230  PainSc: 0-No pain         Complications: No notable events documented.

## 2022-08-07 NOTE — Interval H&P Note (Signed)
History and Physical Interval Note:  08/07/2022 12:40 PM  Michelle Holland  has presented today for surgery, with the diagnosis of internal, external hemorrhoids.  The various methods of treatment have been discussed with the patient and family. After consideration of risks, benefits and other options for treatment, the patient has consented to  Procedure(s): EXAM UNDER ANESTHESIA WITH HEMORRHOIDECTOMY, 2 columns (N/A) as a surgical intervention.  The patient's history has been reviewed, patient examined, no change in status, stable for surgery.  I have reviewed the patient's chart and labs.  Questions were answered to the patient's satisfaction.     [name]

## 2022-08-08 ENCOUNTER — Encounter: Payer: Self-pay | Admitting: Surgery

## 2022-08-08 NOTE — Anesthesia Postprocedure Evaluation (Signed)
Anesthesia Post Note  Patient: Michelle Holland  Procedure(s) Performed: EXAM UNDER ANESTHESIA WITH HEMORRHOIDECTOMY, 2 columns  Patient location during evaluation: PACU Anesthesia Type: General Level of consciousness: awake and alert Pain management: pain level controlled Vital Signs Assessment: post-procedure vital signs reviewed and stable Respiratory status: spontaneous breathing, nonlabored ventilation and respiratory function stable Cardiovascular status: blood pressure returned to baseline and stable Postop Assessment: no apparent nausea or vomiting Anesthetic complications: no   No notable events documented.   Last Vitals:  Vitals:   08/07/22 1445 08/07/22 1500  BP: 119/73 114/79  Pulse: 95 83  Resp: (!) 37 (!) 7  Temp: (!) 36.2 C   SpO2: 100% 100%    Last Pain:  Vitals:   08/07/22 1500  PainSc: 4                  Foye Deer

## 2022-08-22 ENCOUNTER — Ambulatory Visit: Payer: No Typology Code available for payment source | Admitting: Surgery

## 2022-08-22 ENCOUNTER — Encounter: Payer: Self-pay | Admitting: Surgery

## 2022-08-22 VITALS — Ht 59.0 in

## 2022-08-22 DIAGNOSIS — K644 Residual hemorrhoidal skin tags: Secondary | ICD-10-CM

## 2022-08-22 DIAGNOSIS — Z09 Encounter for follow-up examination after completed treatment for conditions other than malignant neoplasm: Secondary | ICD-10-CM

## 2022-08-22 DIAGNOSIS — K648 Other hemorrhoids: Secondary | ICD-10-CM

## 2022-08-22 NOTE — Progress Notes (Signed)
08/22/2022  HPI: Michelle Holland is a 37 y.o. female s/p EUA and hemorrhoidectomy of 3 columns on 08/07/2022.  Patient presents today for follow-up.  Patient reports that initially had some more pain in the perianal area but this has been improving.  The last few days have been much better.  Reports still some discomfort with bowel movements or with wiping and was having some oozing with bowel movements but now has been better the last couple days.  She has been taking MiraLAX to help with bowel movements to prevent constipation issues.  Vital signs: Ht 4\' 11"  (1.499 m)   BMI 32.32 kg/m    Physical Exam: Constitutional: No acute distress Rectal: External exam reveals open wounds from her external hemorrhoidectomy with healthy wound beds with granulation tissue.  No evidence of infection or any complications.  No new enlarged hemorrhoidal tissue.  No gross blood noted.  Assessment/Plan: This is a 37 y.o. female s/p exam under anesthesia with hemorrhoidectomy of 3 columns.  - Discussed with the patient that overall she is healing well and although her hemorrhoidectomy sites are still open, they are healing very appropriately.  There is healthy granulation tissue without any evidence of complications at this point.  Discussed with her that she can continue using the different medications prescribed and to continue avoiding constipation by using her MiraLAX and taking fiber supplement as well.  Continue doing sitz bath's as well. - Follow-up in 1 month to reevaluate her wounds.   Michelle Ill, MD [facility]

## 2022-08-22 NOTE — Patient Instructions (Addendum)
Adjust your Miralax based on how your bowel movements are doing.  You may add a Fiber supplement to your diet like Benefiber or Metamucil.   Continue to do sitz baths especially after bowel movements.  Activity as tolerated.   Follow up in 1 month.

## 2022-09-22 ENCOUNTER — Ambulatory Visit (INDEPENDENT_AMBULATORY_CARE_PROVIDER_SITE_OTHER): Payer: Medicaid Other | Admitting: Surgery

## 2022-09-22 ENCOUNTER — Encounter: Payer: Self-pay | Admitting: Surgery

## 2022-09-22 VITALS — BP 129/88 | HR 85 | Temp 98.0°F | Ht 59.0 in | Wt 162.0 lb

## 2022-09-22 DIAGNOSIS — Z09 Encounter for follow-up examination after completed treatment for conditions other than malignant neoplasm: Secondary | ICD-10-CM

## 2022-09-22 DIAGNOSIS — K644 Residual hemorrhoidal skin tags: Secondary | ICD-10-CM

## 2022-09-22 DIAGNOSIS — K648 Other hemorrhoids: Secondary | ICD-10-CM

## 2022-09-22 NOTE — Patient Instructions (Addendum)
We want you to do sitz baths 1-2 times a day especially after a bowel movement. This will sooth and help heal the area.  Continue to use the Miralax everyday. You may add a Fiber supplement to keep your bowel movements easier. You may try fiber gummies or fiber powder.  We will send in a prescription for Nifedipine cream.  Your prescription for Nifedipine ointment has been called into [facility] in [place]. This is a compounded medication and they are the only pharmacy that does this. Insurance does not normally cover this medication and the cost is $56. Apply a small amount to your finger and place this just inside the rectum three times a day for 6 weeks.  The drug store will call you to let you know when this prescription is ready. [facility] is located at [address], [place], [place] [address] [facility]: [phone]   We will have you follow up here in 3 weeks for a recheck.

## 2022-09-22 NOTE — Progress Notes (Signed)
09/22/2022  HPI: Michelle Holland is a 37 y.o. female s/p exam under anesthesia and hemorrhoidectomy of 3 columns on 08/07/2022.  Patient presents today for follow-up.  She reports that since her last visit, she has had 2 episodes of constipation that caused some pain to linger for a few days.  Currently she had an episode about a week ago and had residual discomfort for 4 days afterwards.  Reports that currently she is feeling better but still reports mild discomfort she has been working on getting her constipation better.  Vital signs: BP 129/88   Pulse 85   Temp 98 F (36.7 C)   Ht 4\' 11"  (1.499 m)   Wt 162 lb (73.5 kg)   LMP 09/18/2022 (Exact Date)   SpO2 98%   BMI 32.72 kg/m    Physical Exam: Constitutional: No acute distress RECTAL: External exam reveals a small tear in the left anterior portion of the anal verge which could be in relationship to the constipation and straining that she had to do.  Digital rectal exam reveals slightly tight sphincter tone with no gross blood or palpable masses.  No new hemorrhoid tissue recurrence.  Assessment/Plan: This is a 37 y.o. female s/p exam under anesthesia and hemorrhoidectomy.  - Discussed with the patient that potentially after surgery she has developed some scar tissue that has narrowed partially the anal canal and with her episodes of constipation the hard stool stretched the anal opening more resulting in a potential tear or fissure anteriorly.  There is no evidence of any hemorrhoid recurrence which the patient was more concerned about.  However in light of this tear, we will treat her with nifedipine ointment that she can apply to the area 3 times daily.  Discussed with her also the need to work on her constipation and start taking MiraLAX daily as well as supplementing with fiber daily.  The goal would be to avoid any constipation and have soft mushy stool. - Patient will follow-up with me in 3 weeks to reassess her  progress.   Howie Ill, MD  [facility]

## 2022-10-13 ENCOUNTER — Emergency Department
Admission: EM | Admit: 2022-10-13 | Discharge: 2022-10-13 | Disposition: A | Discharge status: Home / Self Care | Payer: Medicaid Other | Attending: Emergency Medicine | Admitting: Emergency Medicine

## 2022-10-13 ENCOUNTER — Other Ambulatory Visit: Payer: Self-pay

## 2022-10-13 ENCOUNTER — Encounter: Payer: Self-pay | Admitting: Emergency Medicine

## 2022-10-13 DIAGNOSIS — J029 Acute pharyngitis, unspecified: Secondary | ICD-10-CM | POA: Diagnosis present

## 2022-10-13 DIAGNOSIS — Z20822 Contact with and (suspected) exposure to covid-19: Secondary | ICD-10-CM | POA: Insufficient documentation

## 2022-10-13 DIAGNOSIS — B9789 Other viral agents as the cause of diseases classified elsewhere: Secondary | ICD-10-CM | POA: Diagnosis not present

## 2022-10-13 DIAGNOSIS — J028 Acute pharyngitis due to other specified organisms: Secondary | ICD-10-CM | POA: Insufficient documentation

## 2022-10-13 LAB — SARS CORONAVIRUS 2 BY RT PCR: SARS Coronavirus 2 by RT PCR: NEGATIVE

## 2022-10-13 LAB — GROUP A STREP BY PCR: Group A Strep by PCR: NOT DETECTED

## 2022-10-13 MED ORDER — ACETAMINOPHEN 325 MG PO TABS
650.0000 mg | ORAL_TABLET | Freq: Once | ORAL | Status: AC
  Administered 2022-10-13: 650 mg via ORAL
  Filled 2022-10-13: qty 2

## 2022-10-13 NOTE — ED Triage Notes (Signed)
Pt endorses swollen tonsils since last night.

## 2022-10-13 NOTE — ED Notes (Signed)
Patient declined discharge vital signs. 

## 2022-10-13 NOTE — Discharge Instructions (Signed)
Your COVID and strep test were negative.  Please follow-up with your outpatient provider.  Please return for any new, worsening, or change in symptoms or other concerns.  It was a pleasure caring for you today.

## 2022-10-13 NOTE — ED Provider Notes (Signed)
[facility] Provider Note    Event Date/Time   First MD Initiated Contact with Patient 10/13/22 1345     (approximate)   History   Sore Throat   HPI  Michelle Holland is a 37 y.o. female who presents today for evaluation of sore throat.  Patient reports that her pain began yesterday.  She thought she was having allergic reaction, and called 911 but the EMS said that her airway is widely patent and that this was not an allergic reaction.  She reports that she has pain when she swallows.  No nasal congestion.  No fever.  She has not had a voice change.  No drooling.  She reports that her nephew was sick with similar symptoms.  Patient Active Problem List   Diagnosis Date Noted   Internal and external hemorrhoids without complication 08/07/2022          Physical Exam   Triage Vital Signs: ED Triage Vitals  Encounter Vitals Group     BP 10/13/22 1337 130/87     Systolic BP Percentile --      Diastolic BP Percentile --      Pulse Rate 10/13/22 1337 99     Resp 10/13/22 1337 16     Temp 10/13/22 1337 99.4 F (37.4 C)     Temp Source 10/13/22 1337 Oral     SpO2 10/13/22 1337 100 %     Weight 10/13/22 1336 160 lb 15 oz (73 kg)     Height 10/13/22 1336 4\' 11"  (1.499 m)     Head Circumference --      Peak Flow --      Pain Score 10/13/22 1336 7     Pain Loc --      Pain Education --      Exclude from Growth Chart --     Most recent vital signs: Vitals:   10/13/22 1337  BP: 130/87  Pulse: 99  Resp: 16  Temp: 99.4 F (37.4 C)  SpO2: 100%    Physical Exam Vitals and nursing note reviewed.  Constitutional:      General: Awake and alert. No acute distress.    Appearance: Normal appearance. The patient is normal weight.  HENT:     Head: Normocephalic and atraumatic.     Mouth: Mucous membranes are moist. Uvula midline.  No tonsillar exudate.  No soft palate fluctuance.  No trismus.  No voice change.  No sublingual swelling.  No tender  cervical lymphadenopathy.  No nuchal rigidity Eyes:     General: PERRL. Normal EOMs        Right eye: No discharge.        Left eye: No discharge.     Conjunctiva/sclera: Conjunctivae normal.  Cardiovascular:     Rate and Rhythm: Normal rate and regular rhythm.     Pulses: Normal pulses.  Pulmonary:     Effort: Pulmonary effort is normal. No respiratory distress.     Breath sounds: Normal breath sounds.  Abdominal:     Abdomen is soft. There is no abdominal tenderness. No rebound or guarding. No distention. Musculoskeletal:        General: No swelling. Normal range of motion.     Cervical back: Normal range of motion and neck supple.  Skin:    General: Skin is warm and dry.     Capillary Refill: Capillary refill takes less than 2 seconds.     Findings: No rash.  Neurological:  Mental Status: The patient is awake and alert.      ED Results / Procedures / Treatments   Labs (all labs ordered are listed, but only abnormal results are displayed) Labs Reviewed  GROUP A STREP BY PCR  SARS CORONAVIRUS 2 BY RT PCR     EKG     RADIOLOGY     PROCEDURES:  Critical Care performed:   Procedures   MEDICATIONS ORDERED IN ED: Medications  acetaminophen (TYLENOL) tablet 650 mg (650 mg Oral Given 10/13/22 1405)     IMPRESSION / MDM / ASSESSMENT AND PLAN / ED COURSE  I reviewed the triage vital signs and the nursing notes.   Differential diagnosis includes, but is not limited to, viral pharyngitis, strep pharyngitis, COVID-19.  Patient is awake and alert, hemodynamically stable and afebrile.  She is nontoxic in appearance.  There is no nuchal rigidity.  No uvular deviation, no trismus, no drooling, no voice change, no neck pain or stiffness, not consistent with peritonsillar or retropharyngeal abscess.  I suspect this is viral in etiology.  I do not suspect allergic reaction like she initially thought given no itchy nests, no trouble breathing, no trouble swallowing  only pain with swallowing, no rash, no nausea vomiting or diarrhea, or any other signs of allergic reaction.  COVID and strep swabs were negative.  I recommended symptomatic management and outpatient follow-up.  Patient understands and agrees with plan.  She was discharged in stable condition.   Patient's presentation is most consistent with acute complicated illness / injury requiring diagnostic workup.     FINAL CLINICAL IMPRESSION(S) / ED DIAGNOSES   Final diagnoses:  Viral pharyngitis     Rx / DC Orders   ED Discharge Orders     None        Note:  This document was prepared using Dragon voice recognition software and may include unintentional dictation errors.   Jackelyn Hoehn, PA-C 10/13/22 1448    Pilar Jarvis, MD 10/13/22 [phone]

## 2022-10-17 ENCOUNTER — Ambulatory Visit (INDEPENDENT_AMBULATORY_CARE_PROVIDER_SITE_OTHER): Payer: Medicaid Other | Admitting: Surgery

## 2022-10-17 ENCOUNTER — Encounter: Payer: Self-pay | Admitting: Surgery

## 2022-10-17 VITALS — BP 115/81 | HR 91 | Temp 98.3°F | Ht 59.0 in | Wt 166.0 lb

## 2022-10-17 DIAGNOSIS — K648 Other hemorrhoids: Secondary | ICD-10-CM

## 2022-10-17 DIAGNOSIS — Z09 Encounter for follow-up examination after completed treatment for conditions other than malignant neoplasm: Secondary | ICD-10-CM

## 2022-10-17 DIAGNOSIS — K644 Residual hemorrhoidal skin tags: Secondary | ICD-10-CM

## 2022-10-17 NOTE — Patient Instructions (Signed)
Complete the full 6 weeks of the Nifedipine.    Follow-up with our office as needed.  Please call and ask to speak with a nurse if you develop questions or concerns.

## 2022-10-18 ENCOUNTER — Encounter: Payer: Self-pay | Admitting: Surgery

## 2022-10-18 NOTE — Progress Notes (Signed)
10/18/2022  HPI: Michelle Holland is a 37 y.o. female s/p exam under anesthesia and hemorrhoidectomy of 3 columns on 08/07/2022.  Patient presents today for follow-up.  On her last visit on 09/22/22, there was some evidence of scarring at the anal verge with a small anterior tear and was started on nifedipine ointment.  She reports that this has helped and reports that her pain and bleeding have improved.  She does work on bowel regimen to keep the stool mushy.  Vital signs: BP 115/81   Pulse 91   Temp 98.3 F (36.8 C)   Ht 4\' 11"  (1.499 m)   Wt 166 lb (75.3 kg)   LMP 09/18/2022 (Exact Date)   SpO2 98%   BMI 33.53 kg/m    Physical Exam: Constitutional: No acute distress RECTAL: External exam reveals a healing anterior wound, without any bleeding.  No enlarged external tissues.  DRE deferred today.  Assessment/Plan: This is a 37 y.o. female s/p exam under anesthesia and hemorrhoidectomy.  - The patient has been improving with the use of Nifedipine with less pain and bleeding.  She has been working well with her bowel regimen to maintain soft stools.  Recommended that she continue the nifedipine regimen to complete a 6 week course and then can stop.  If she feels any recurrence of symptoms, can do another 6 week course and also contact us so we can examine her again. --Otherwise, for now, given that she's been improving, can follow up as needed.   Howie Ill, MD [facility]

## 2022-11-05 ENCOUNTER — Other Ambulatory Visit: Payer: Self-pay

## 2022-11-05 ENCOUNTER — Emergency Department
Admission: EM | Admit: 2022-11-05 | Discharge: 2022-11-05 | Disposition: A | Discharge status: Home / Self Care | Payer: Medicaid Other | Attending: Student in an Organized Health Care Education/Training Program | Admitting: Student in an Organized Health Care Education/Training Program

## 2022-11-05 ENCOUNTER — Emergency Department: Payer: Medicaid Other

## 2022-11-05 DIAGNOSIS — R1013 Epigastric pain: Secondary | ICD-10-CM | POA: Diagnosis not present

## 2022-11-05 DIAGNOSIS — R079 Chest pain, unspecified: Secondary | ICD-10-CM | POA: Diagnosis present

## 2022-11-05 DIAGNOSIS — R0789 Other chest pain: Secondary | ICD-10-CM | POA: Insufficient documentation

## 2022-11-05 LAB — HEPATIC FUNCTION PANEL
ALT: 14 U/L (ref 0–44)
AST: 15 U/L (ref 15–41)
Albumin: 3.7 g/dL (ref 3.5–5.0)
Alkaline Phosphatase: 39 U/L (ref 38–126)
Bilirubin, Direct: 0.1 mg/dL (ref 0.0–0.2)
Indirect Bilirubin: 0.6 mg/dL (ref 0.3–0.9)
Total Bilirubin: 0.7 mg/dL (ref 0.3–1.2)
Total Protein: 6.8 g/dL (ref 6.5–8.1)

## 2022-11-05 LAB — BASIC METABOLIC PANEL
Anion gap: 10 (ref 5–15)
BUN: 12 mg/dL (ref 6–20)
CO2: 23 mmol/L (ref 22–32)
Calcium: 8.5 mg/dL — ABNORMAL LOW (ref 8.9–10.3)
Chloride: 103 mmol/L (ref 98–111)
Creatinine, Ser: 0.66 mg/dL (ref 0.44–1.00)
GFR, Estimated: >60 mL/min (ref >60)
Glucose, Bld: 83 mg/dL (ref 70–99)
Potassium: 3.6 mmol/L (ref 3.5–5.1)
Sodium: 136 mmol/L (ref 135–145)

## 2022-11-05 LAB — CBC
HCT: 38.5 % (ref 36.0–46.0)
Hemoglobin: 13.2 g/dL (ref 12.0–15.0)
MCH: 31.9 pg (ref 26.0–34.0)
MCHC: 34.3 g/dL (ref 30.0–36.0)
MCV: 93 fL (ref 80.0–100.0)
Platelets: 166 10*3/uL (ref 150–400)
RBC: 4.14 MIL/uL (ref 3.87–5.11)
RDW: 12.6 % (ref 11.5–15.5)
WBC: 7.9 10*3/uL (ref 4.0–10.5)
nRBC: 0 % (ref 0.0–0.2)

## 2022-11-05 LAB — TROPONIN I (HIGH SENSITIVITY)
Troponin I (High Sensitivity): <2 ng/L (ref <18)
Troponin I (High Sensitivity): <2 ng/L (ref <18)

## 2022-11-05 LAB — LIPASE, BLOOD: Lipase: 35 U/L (ref 11–51)

## 2022-11-05 MED ORDER — DIAZEPAM 5 MG PO TABS
5.0000 mg | ORAL_TABLET | Freq: Once | ORAL | Status: AC
  Administered 2022-11-05: 5 mg via ORAL
  Filled 2022-11-05: qty 1

## 2022-11-05 MED ORDER — ALUM & MAG HYDROXIDE-SIMETH 200-200-20 MG/5ML PO SUSP
30.0000 mL | Freq: Once | ORAL | Status: AC
  Administered 2022-11-05: 30 mL via ORAL
  Filled 2022-11-05: qty 30

## 2022-11-05 NOTE — ED Notes (Signed)
Labs collected by this RN. 2 attempts

## 2022-11-05 NOTE — ED Notes (Signed)
Lab contacted x2 for labs

## 2022-11-05 NOTE — ED Triage Notes (Signed)
First nurse note: Pt to ED [facility] from work at [employer] for left sided chest pain while working. Denies n/v/shob +dizziness.  Allergic to ASA.

## 2022-11-05 NOTE — ED Notes (Signed)
Lab contacted x3 to collect labs

## 2022-11-05 NOTE — ED Provider Notes (Signed)
[facility] Provider Note    Event Date/Time   First MD Initiated Contact with Patient 11/05/22 Rickey Primus     (approximate)   History   Chest Pain   HPI  Michelle Holland is a 37 y.o. female   presents to the ER for evaluation of episode of left-sided chest pain while at work.  No nausea or vomiting.  Did feel lightheaded and dizzy.  Has been under quite a bit of stress recently.  No diaphoresis no shortness of breath.  Larey Seat like the pain started epigastric region and radiated up into her chest.  Does have history of reflux and bypass.  Denies any abdominal pain at this time.      Physical Exam   Triage Vital Signs: ED Triage Vitals  Encounter Vitals Group     BP 11/05/22 1622 (!) 139/102     Systolic BP Percentile --      Diastolic BP Percentile --      Pulse Rate 11/05/22 1622 80     Resp 11/05/22 1622 18     Temp 11/05/22 1621 98.2 F (36.8 C)     Temp Source 11/05/22 1621 Oral     SpO2 11/05/22 1622 100 %     Weight 11/05/22 1621 166 lb (75.3 kg)     Height 11/05/22 1621 4\' 11"  (1.499 m)     Head Circumference --      Peak Flow --      Pain Score 11/05/22 1622 8     Pain Loc --      Pain Education --      Exclude from Growth Chart --     Most recent vital signs: Vitals:   11/05/22 1621 11/05/22 1622  BP:  (!) 139/102  Pulse:  80  Resp:  18  Temp: 98.2 F (36.8 C)   SpO2:  100%     Constitutional: Alert  Eyes: Conjunctivae are normal.  Head: Atraumatic. Nose: No congestion/rhinnorhea. Mouth/Throat: Mucous membranes are moist.   Neck: Painless ROM.  Cardiovascular:   Good peripheral circulation. Respiratory: Normal respiratory effort.  No retractions.  Gastrointestinal: Soft and nontender.  Musculoskeletal:  no deformity Neurologic:  MAE spontaneously. No gross focal neurologic deficits are appreciated.  Skin:  Skin is warm, dry and intact. No rash noted. Psychiatric: Mood and affect are normal. Speech and behavior are  normal.    ED Results / Procedures / Treatments   Labs (all labs ordered are listed, but only abnormal results are displayed) Labs Reviewed  BASIC METABOLIC PANEL - Abnormal; Notable for the following components:      Result Value   Calcium 8.5 (*)    All other components within normal limits  CBC  HEPATIC FUNCTION PANEL  LIPASE, BLOOD  POC URINE PREG, ED  TROPONIN I (HIGH SENSITIVITY)  TROPONIN I (HIGH SENSITIVITY)     EKG  ED ECG REPORT I, Willy Eddy, the attending physician, personally viewed and interpreted this ECG.   Date: 11/05/2022  EKG Time: 16:33  Rate: 75  Rhythm: sinus  Axis: normal  Intervals: normal  ST&T Change: no stemi, no depressions    RADIOLOGY Please see ED Course for my review and interpretation.  I personally reviewed all radiographic images ordered to evaluate for the above acute complaints and reviewed radiology reports and findings.  These findings were personally discussed with the patient.  Please see medical record for radiology report.    PROCEDURES:  Critical Care performed:  Procedures   MEDICATIONS ORDERED IN ED: Medications  alum & mag hydroxide-simeth (MAALOX/MYLANTA) 200-200-20 MG/5ML suspension 30 mL (30 mLs Oral Given 11/05/22 1925)  diazepam (VALIUM) tablet 5 mg (5 mg Oral Given 11/05/22 2127)     IMPRESSION / MDM / ASSESSMENT AND PLAN / ED COURSE  I reviewed the triage vital signs and the nursing notes.                              Differential diagnosis includes, but is not limited to, ACS, pericarditis, esophagitis, boerhaaves, pe, dissection, pna, bronchitis, costochondritis  Patient presenting to the ER for evaluation of symptoms as described above.  Based on symptoms, risk factors and considered above differential, this presenting complaint could reflect a potentially life-threatening illness therefore the patient will be placed on continuous pulse oximetry and telemetry for monitoring.  Laboratory  evaluation will be sent to evaluate for the above complaints.  Patient well-appearing no acute distress.  Her exam is reassuring.  EKG is nonischemic.  Initial troponin negative.  Given history and description of pain will order LFTs and lipase.  Lowers by Wells criteria is PERC negative.  Chest x-ray on my review and interpretation without evidence of consolidation or pneumothorax.   Clinical Course as of 11/05/22 2239  Wed Nov 05, 2022  2126 Blood work is reassuring.  Patient now admits to being under quite a bit of stress.  Imaging workup is without clear explanation for the patient's symptoms.  This is not consistent with PE or dissection.  Will give Valium as the patient requesting something for anxiety will reassess. [PR]    Clinical Course User Index [PR] Willy Eddy, MD   Patient was significant improvement in symptoms after Valium.  Suspect component of anxiety and stress reaction.  Given her reassuring workup does appear stable and appropriate for outpatient follow-up.  FINAL CLINICAL IMPRESSION(S) / ED DIAGNOSES   Final diagnoses:  Atypical chest pain     Rx / DC Orders   ED Discharge Orders     None        Note:  This document was prepared using Dragon voice recognition software and may include unintentional dictation errors.    Willy Eddy, MD 11/05/22 2239

## 2022-11-05 NOTE — ED Notes (Signed)
See triage notes. Patient c/o left sided chest pain while working.

## 2022-12-31 DIAGNOSIS — Z9884 Bariatric surgery status: Secondary | ICD-10-CM | POA: Diagnosis not present

## 2022-12-31 DIAGNOSIS — Z1321 Encounter for screening for nutritional disorder: Secondary | ICD-10-CM | POA: Diagnosis not present

## 2022-12-31 DIAGNOSIS — G4730 Sleep apnea, unspecified: Secondary | ICD-10-CM | POA: Diagnosis not present

## 2022-12-31 DIAGNOSIS — R634 Abnormal weight loss: Secondary | ICD-10-CM | POA: Diagnosis not present

## 2022-12-31 DIAGNOSIS — R7303 Prediabetes: Secondary | ICD-10-CM | POA: Diagnosis not present

## 2022-12-31 DIAGNOSIS — E282 Polycystic ovarian syndrome: Secondary | ICD-10-CM | POA: Diagnosis not present

## 2023-01-02 DIAGNOSIS — Z713 Dietary counseling and surveillance: Secondary | ICD-10-CM | POA: Diagnosis not present

## 2023-01-02 DIAGNOSIS — R634 Abnormal weight loss: Secondary | ICD-10-CM | POA: Diagnosis not present

## 2023-01-02 DIAGNOSIS — Z9884 Bariatric surgery status: Secondary | ICD-10-CM | POA: Diagnosis not present

## 2023-01-02 DIAGNOSIS — Z6830 Body mass index (BMI) 30.0-30.9, adult: Secondary | ICD-10-CM | POA: Diagnosis not present

## 2023-01-13 DIAGNOSIS — N921 Excessive and frequent menstruation with irregular cycle: Secondary | ICD-10-CM | POA: Diagnosis not present

## 2023-01-13 DIAGNOSIS — N9089 Other specified noninflammatory disorders of vulva and perineum: Secondary | ICD-10-CM | POA: Diagnosis not present

## 2023-01-13 DIAGNOSIS — Z113 Encounter for screening for infections with a predominantly sexual mode of transmission: Secondary | ICD-10-CM | POA: Diagnosis not present

## 2023-01-13 DIAGNOSIS — Z975 Presence of (intrauterine) contraceptive device: Secondary | ICD-10-CM | POA: Diagnosis not present

## 2023-03-12 DIAGNOSIS — Z975 Presence of (intrauterine) contraceptive device: Secondary | ICD-10-CM | POA: Diagnosis not present

## 2023-03-12 DIAGNOSIS — N921 Excessive and frequent menstruation with irregular cycle: Secondary | ICD-10-CM | POA: Diagnosis not present

## 2023-03-18 DIAGNOSIS — N921 Excessive and frequent menstruation with irregular cycle: Secondary | ICD-10-CM | POA: Diagnosis not present

## 2023-04-06 DIAGNOSIS — R350 Frequency of micturition: Secondary | ICD-10-CM | POA: Diagnosis not present

## 2023-04-06 DIAGNOSIS — N760 Acute vaginitis: Secondary | ICD-10-CM | POA: Diagnosis not present
